# Patient Record
Sex: Female | Born: 1980 | Race: Black or African American | Hispanic: No | Marital: Married
Health system: Midwestern US, Academic
[De-identification: ages and names within clinical notes are randomized; demographics above are authoritative.]

## PROBLEM LIST (undated history)

## (undated) DIAGNOSIS — Z789 Other specified health status: Secondary | ICD-10-CM

## (undated) HISTORY — PX: TOOTH EXTRACTION: SUR596

---

## 2001-11-03 HISTORY — PX: THERAPEUTIC ABORTION: SHX798

## 2004-11-03 HISTORY — PX: OTHER SURGICAL HISTORY: SHX169

## 2004-11-03 HISTORY — PX: DILATION AND CURETTAGE OF UTERUS: SHX78

## 2012-02-04 ENCOUNTER — Encounter (HOSPITAL_COMMUNITY): Payer: Self-pay | Admitting: *Deleted

## 2012-02-04 ENCOUNTER — Inpatient Hospital Stay (HOSPITAL_COMMUNITY)
Admission: AD | Admit: 2012-02-04 | Discharge: 2012-02-04 | Disposition: A | Discharge status: Home / Self Care | Payer: PRIVATE HEALTH INSURANCE | Source: Ambulatory Visit | Attending: Obstetrics & Gynecology | Admitting: Obstetrics & Gynecology

## 2012-02-04 DIAGNOSIS — N949 Unspecified condition associated with female genital organs and menstrual cycle: Secondary | ICD-10-CM | POA: Insufficient documentation

## 2012-02-04 DIAGNOSIS — R102 Pelvic and perineal pain: Secondary | ICD-10-CM

## 2012-02-04 DIAGNOSIS — R109 Unspecified abdominal pain: Secondary | ICD-10-CM | POA: Insufficient documentation

## 2012-02-04 HISTORY — DX: Other specified health status: Z78.9

## 2012-02-04 LAB — DIFFERENTIAL
Basophils Relative: 0 % (ref 0–1)
Eosinophils Absolute: 0.1 10*3/uL (ref 0.0–0.7)
Eosinophils Relative: 1 % (ref 0–5)
Lymphs Abs: 2.9 10*3/uL (ref 0.7–4.0)
Monocytes Absolute: 0.5 10*3/uL (ref 0.1–1.0)
Monocytes Relative: 7 % (ref 3–12)
Neutrophils Relative %: 48 % (ref 43–77)

## 2012-02-04 LAB — URINALYSIS, ROUTINE W REFLEX MICROSCOPIC
Bilirubin Urine: NEGATIVE
Glucose, UA: NEGATIVE mg/dL
Hgb urine dipstick: NEGATIVE
Ketones, ur: NEGATIVE mg/dL
Protein, ur: NEGATIVE mg/dL
Urobilinogen, UA: 0.2 mg/dL (ref 0.0–1.0)

## 2012-02-04 LAB — CBC
Hemoglobin: 13.2 g/dL (ref 12.0–15.0)
MCH: 28.6 pg (ref 26.0–34.0)
MCHC: 33.4 g/dL (ref 30.0–36.0)
MCV: 85.5 fL (ref 78.0–100.0)

## 2012-02-04 MED ORDER — KETOROLAC TROMETHAMINE 60 MG/2ML IM SOLN
60.0000 mg | Freq: Once | INTRAMUSCULAR | Status: AC
  Administered 2012-02-04: 60 mg via INTRAMUSCULAR
  Filled 2012-02-04: qty 2

## 2012-02-04 NOTE — MAU Note (Signed)
Pt had intercourse this AM about 10am, intercourse was vigorous (by husband) and pt experienced pain as severe as her TAB 10 years ago. Pt unable to walk x 1 hour.  Pt went to student health at [employer] and was told to come to MAU for evaluation. LMP March 13 and bleeding as usual with that period.  Hx TAB  10 yrs ago, D&C 2006 (UPT neg) for abnormal bleeding and later in 2006 had SAB.  No birthcontrol used ,  UPT at student health neg and repeated here

## 2012-02-04 NOTE — MAU Provider Note (Signed)
History     CSN: [id number]  Arrival date and time: 02/04/12 1613   First Provider Initiated Contact with Patient 02/04/12 1934      Chief Complaint  Patient presents with  . Abdominal Pain   HPI Cheryl Rollins is 31 y.o. G2P0020 presents after being seen at the clinic at [facility] [employer] for pelvic pain.  They sent her here for evaluation.  LMP 01/14/12.  Not using contraception.  10 years ago she had an abortion and then a miscarriage, D&C in past and is concerned about her fertility, also with irregular cycles.  Intercourse at 10:30 and pain occurred during sex.  "most excruciating pain" after rolling over.  Now the pain is less only when she sitting to standing.  Took tylenol at 11:30.  One partner-husband.    Past Medical History  Diagnosis Date  . No pertinent past medical history     Past Surgical History  Procedure Date  . Therapeutic abortion 2003  . Dilation and curettage of uterus 2006  . Miscarriage 2006  . Tooth extraction     Family History  Problem Relation Age of Onset  . Anesthesia problems Neg Hx     History  Substance Use Topics  . Smoking status: Current Everyday Smoker -- 0.5 packs/day    Types: Cigarettes  . Smokeless tobacco: Never Used  . Alcohol Use: No    Allergies: No Known Allergies  Prescriptions prior to admission  Medication Sig Dispense Refill  . acetaminophen (TYLENOL) 500 MG tablet Take 500 mg by mouth every 6 (six) hours as needed. Stomach pains        Review of Systems  Gastrointestinal: Negative for nausea and vomiting.  Genitourinary:       + pelvic pain after intercourse   Physical Exam   Blood pressure 129/84, pulse 75, temperature 98.3 F (36.8 C), resp. rate 18, height 5\' 9"  (1.753 m), weight 118.842 kg (262 lb), last menstrual period 01/14/2012.  Physical Exam  Constitutional: She appears well-developed and well-nourished.  HENT:  Head: Normocephalic.  Neck: Normal range of motion.  Cardiovascular: Normal rate.     Respiratory: Effort normal.  GI: Soft. There is tenderness (mild lower abdominal pain).  Genitourinary: Uterus is not enlarged and not tender. Cervix exhibits no discharge and no friability. Right adnexum displays no mass, no tenderness and no fullness. Left adnexum displays tenderness (mild). Left adnexum displays no mass and no fullness. Vaginal discharge (normal appearing discharge without odor) found.   Results for orders placed during the hospital encounter of 02/04/12 (from the past 24 hour(s))  URINALYSIS, ROUTINE W REFLEX MICROSCOPIC     Status: Normal   Collection Time   02/04/12  5:00 PM      Component Value Range   Color, Urine YELLOW  YELLOW    APPearance CLEAR  CLEAR    Specific Gravity, Urine 1.025  1.005 - 1.030    pH 6.5  5.0 - 8.0    Glucose, UA NEGATIVE  NEGATIVE (mg/dL)   Hgb urine dipstick NEGATIVE  NEGATIVE    Bilirubin Urine NEGATIVE  NEGATIVE    Ketones, ur NEGATIVE  NEGATIVE (mg/dL)   Protein, ur NEGATIVE  NEGATIVE (mg/dL)   Urobilinogen, UA 0.2  0.0 - 1.0 (mg/dL)   Nitrite NEGATIVE  NEGATIVE    Leukocytes, UA NEGATIVE  NEGATIVE   POCT PREGNANCY, URINE     Status: Normal   Collection Time   02/04/12  5:19 PM      Component Value  Range   Preg Test, Ur NEGATIVE  NEGATIVE   CBC     Status: Normal   Collection Time   02/04/12  5:34 PM      Component Value Range   WBC 6.7  4.0 - 10.5 (K/uL)   RBC 4.62  3.87 - 5.11 (MIL/uL)   Hemoglobin 13.2  12.0 - 15.0 (g/dL)   HCT 16.1  09.6 - 04.5 (%)   MCV 85.5  78.0 - 100.0 (fL)   MCH 28.6  26.0 - 34.0 (pg)   MCHC 33.4  30.0 - 36.0 (g/dL)   RDW 40.9  81.1 - 91.4 (%)   Platelets 205  150 - 400 (K/uL)  DIFFERENTIAL     Status: Normal   Collection Time   02/04/12  5:34 PM      Component Value Range   Neutrophils Relative 48  43 - 77 (%)   Neutro Abs 3.2  1.7 - 7.7 (K/uL)   Lymphocytes Relative 44  12 - 46 (%)   Lymphs Abs 2.9  0.7 - 4.0 (K/uL)   Monocytes Relative 7  3 - 12 (%)   Monocytes Absolute 0.5  0.1 - 1.0 (K/uL)    Eosinophils Relative 1  0 - 5 (%)   Eosinophils Absolute 0.1  0.0 - 0.7 (K/uL)   Basophils Relative 0  0 - 1 (%)   Basophils Absolute 0.0  0.0 - 0.1 (K/uL)   MAU Course  Procedures  MDM   Toradol 60mg  IM ordered.   Assessment and Plan  A:  Pelvic Pain after intercourse      Normal exam findings  P:  May use ibuprofen tomorrow for any remaining discomfort     Return if sxs worsen.    Azile Minardi,EVE M 02/04/2012, 7:35 PM

## 2012-02-04 NOTE — Discharge Instructions (Signed)
Pelvic Pain in Women, Generic  Pelvic pain may be constant or come and go. It may be mild or severe. It is important to tell your caregiver exactly where the pain is located, when and how it occurs, and if it is related to your menstrual periods or stress. We have not found a definite cause for your pelvic pain today and you may need follow-up testing and examination.  CAUSES    Sexually transmitted diseases (STDS) cause pelvic inflammatory disease (PID). This is one of the most common causes of pelvic pain. It is an infection of the female sexual organs.   Endometriosis - This is a condition where some of the inside lining of the uterus is growing in the pelvis and abdomen outside the uterus. Along with (chronic) pain, this can cause infertility.   Tubal pregnancy - This is a serious condition where the pregnancy has occurred in a fallopian tube. Rupture of the tube can bleed heavily and cause death if it is not found in time.   Interstitial cystitis is an inflammation of the bladder that causes pelvic pain. People with severe cases of IC may urinate as many as 60 times a day.   Fibroids: A small percentage of women have uterine fibroids (non-cancerous smooth muscle growths in the uterus). Fibroids do not always cause pain.   Fibromyalgia is a disorder with symptoms of widespread muscle pain, fatigue and multiple tender points on the body.   Dysmenorrhea is painful menstrual periods.   Mittlesmertz is pain with ovulation.   Pelvic congestive syndrome, is engorgement of the pelvic veins just before and during a menstrual period.   Cervical stenosis is when the opening of the cervix is too small and causes pain during menstruation.   Adenomyosis (a type of endometriosis) glands that line the inside of the uterus lying in the muscle layer of the uterus.   Intestinal problems such as irritable bowel syndrome colitis or ileitis.   Appendicitis.   Pelvic cancer. Usually the cancer has been there for awhile  before causing pain.   Bladder infection.   Cysts or ovarian tumors.   Kidney stone.   Psychological factors (stress, sexual abuse or depression).   IUD (intrauterine device).   Prolapse (falling down of the uterus).   Retroflexed uterus - the uterus is tipped too far backwards.   Muscle spasms of the pelvic muscles.   Muscular-skeletal problems of the back (herniated disc).  DIAGNOSIS    Your caregiver may order testing, such as:   Blood tests.   Cultures to test for infection.   Ultrasound.   Looking into the bladder with a metal tube with a light (cystoscopy).   Looking into the pelvis and abdomen with very small incisions through a metal tube with a light (laparoscopy).   Looking into the large intestine with a fibro-optic tube with a light (colonoscopy).   CT scan - a type of X-ray to view the internal organs of the pelvis and abdomen.   MRI - views the pelvic and abdominal organs with a magnetic machine.   Intravenous pyelogram - views the kidneys, ureter and bladder after injecting dye through the vein by X-rays.   Injecting barium into the large intestine to view the intestine with X-rays (barium enema).   Not all test results are available during your visit. If your test results are not back during the visit, make an appointment with your caregiver or the medical facility. It is important for you to follow up   on all of your test results.  TREATMENT   Treatment will depend on the cause of the pain, such as:   Medication, antibiotics, pain medication, muscle relaxants, anti-depression drugs, hormones or birth control pills.   Physical therapy.   Acupuncture.   Psychiatric counseling.   Nerve blocks.   Surgery.  HOME CARE INSTRUCTIONS    Finish all medication as prescribed. Incomplete treatment will put you at risk for sterility and tubal pregnancy if your caregiver feels your pain is caused by an infection.   Rest and eat a balanced diet with plenty of fluids.   If you do have an  infection, your recent sexual partners may need treatment even if they are symptom-free or have a negative culture or evaluation. You also need follow-up to make sure you are no longer infected.   Only take over-the-counter or prescription medicines for pain, discomfort or fever as directed by your caregiver.   Apply warm or cold compresses to the lower abdomen depending on which one helps the pain.   Avoid stressful situations that may cause the pain.   Group therapy is sometimes helpful.   Make sure to follow all instructions. Some of the conditions listed above can have very serious outcomes if you do not take the time to follow-up with your caregiver.  SEEK IMMEDIATE MEDICAL CARE IF:    There is heavier bleeding from the birth canal (vagina).   You develop increasing abdominal pain.   You feel lightheaded or pass out.   An unexplained oral temperature above 102 F (38.9 C) develops.   Any of the problems which brought you to us are getting worse.   You are being physically or sexually abused.   You have painful urination.   You are still having pain four hours after taking prescription pain medication.   You have uncontrolled diarrhea.   You have abnormal vaginal discharge.  Document Released: 09/16/2004 Document Revised: 10/09/2011 Document Reviewed: 10/17/2008  ExitCare Patient Information 2012 [employer].

## 2012-02-04 NOTE — MAU Note (Signed)
Pt states no bleeding, still painful, rates pain 4 with movement.

## 2013-11-19 ENCOUNTER — Emergency Department (HOSPITAL_COMMUNITY): Payer: BC Managed Care – PPO

## 2013-11-19 ENCOUNTER — Encounter (HOSPITAL_COMMUNITY): Payer: Self-pay | Admitting: Emergency Medicine

## 2013-11-19 ENCOUNTER — Emergency Department (HOSPITAL_COMMUNITY)
Admission: EM | Admit: 2013-11-19 | Discharge: 2013-11-19 | Disposition: A | Discharge status: Home / Self Care | Payer: BC Managed Care – PPO | Attending: Emergency Medicine | Admitting: Emergency Medicine

## 2013-11-19 DIAGNOSIS — I493 Ventricular premature depolarization: Secondary | ICD-10-CM

## 2013-11-19 DIAGNOSIS — R42 Dizziness and giddiness: Secondary | ICD-10-CM | POA: Insufficient documentation

## 2013-11-19 DIAGNOSIS — I4949 Other premature depolarization: Secondary | ICD-10-CM | POA: Insufficient documentation

## 2013-11-19 DIAGNOSIS — F17200 Nicotine dependence, unspecified, uncomplicated: Secondary | ICD-10-CM | POA: Insufficient documentation

## 2013-11-19 LAB — CBC
HCT: 36.6 % (ref 36.0–46.0)
Hemoglobin: 12.2 g/dL (ref 12.0–15.0)
MCH: 28.4 pg (ref 26.0–34.0)
MCHC: 33.3 g/dL (ref 30.0–36.0)
MCV: 85.3 fL (ref 78.0–100.0)
PLATELETS: 206 10*3/uL (ref 150–400)
RBC: 4.29 MIL/uL (ref 3.87–5.11)
RDW: 13.2 % (ref 11.5–15.5)
WBC: 6.1 10*3/uL (ref 4.0–10.5)

## 2013-11-19 LAB — BASIC METABOLIC PANEL
BUN: 14 mg/dL (ref 6–23)
CALCIUM: 9.2 mg/dL (ref 8.4–10.5)
CHLORIDE: 103 meq/L (ref 96–112)
CO2: 24 meq/L (ref 19–32)
Creatinine, Ser: 0.69 mg/dL (ref 0.50–1.10)
GFR calc Af Amer: >90 mL/min (ref >90)
GFR calc non Af Amer: >90 mL/min (ref >90)
Glucose, Bld: 90 mg/dL (ref 70–99)
Potassium: 3.8 mEq/L (ref 3.7–5.3)
SODIUM: 139 meq/L (ref 137–147)

## 2013-11-19 LAB — TROPONIN I

## 2013-11-19 LAB — D-DIMER, QUANTITATIVE (NOT AT ARMC): D DIMER QUANT: 0.65 ug{FEU}/mL — AB (ref 0.00–0.48)

## 2013-11-19 MED ORDER — SODIUM CHLORIDE 0.9 % IV BOLUS (SEPSIS)
1000.0000 mL | Freq: Once | INTRAVENOUS | Status: AC
  Administered 2013-11-19: 1000 mL via INTRAVENOUS

## 2013-11-19 MED ORDER — IOHEXOL 350 MG/ML SOLN
100.0000 mL | Freq: Once | INTRAVENOUS | Status: AC | PRN
  Administered 2013-11-19: 100 mL via INTRAVENOUS

## 2013-11-19 NOTE — Discharge Instructions (Signed)
1. Medications: usual home medications 2. Treatment: rest, drink plenty of fluids,  3. Follow Up: Please followup with your primary doctor for discussion of your diagnoses and further evaluation after today's visit; if you do not have a primary care doctor use the resource guide provided to find one; Please also follow-up with [facility] for further evaluation of your symptoms   Premature Ventricular Contraction Premature ventricular contraction (PVC) is an irregularity of the heart rhythm involving extra or skipped heartbeats. In some cases, they may occur without obvious cause or heart disease. Other times, they can be caused by an electrolyte change in the blood. These need to be corrected. They can also be seen when there is not enough oxygen going to the heart. A common cause of this is plaque or cholesterol buildup. This buildup decreases the blood supply to the heart. In addition, extra beats may be caused or aggravated by:  Excessive smoking.  Alcohol consumption.  Caffeine.  Certain medications  Some street drugs. SYMPTOMS   The sensation of feeling your heart skipping a beat (palpitations).  In many cases, the person may have no symptoms. SIGNS AND TESTS   A physical examination may show an occasional irregularity, but if the PVC beats do not happen often, they may not be found on physical exam.  Blood pressure is usually normal.  Other tests that may find extra beats of the heart are:  An EKG (electrocardiogram)  A Holter monitor which can monitor your heart over longer periods of time  An Angiogram (study of the heart arteries). TREATMENT  Usually extra heartbeats do not need treatment. The condition is treated only if symptoms are severe or if extra beats are very frequent or are causing problems. An underlying cause, if discovered, may also require treatment.  Treatment may also be needed if there may be a risk for other more serious cardiac arrhythmias.    PREVENTION   Moderation in caffeine, alcohol, and tobacco use may reduce the risk of ectopic heartbeats in some people.  Exercise often helps people who lead a sedentary (inactive) lifestyle. PROGNOSIS  PVC heartbeats are generally harmless and do not need treatment.  RISKS AND COMPLICATIONS   Ventricular tachycardia (occasionally).  There usually are no complications.  Other arrhythmias (occasionally). SEEK IMMEDIATE MEDICAL CARE IF:   You feel palpitations that are frequent or continual.  You develop chest pain or other problems such as shortness of breath, sweating, or nausea and vomiting.  You become light-headed or faint (pass out).  You get worse or do not improve with treatment. Document Released: 06/06/2004 Document Revised: 01/12/2012 Document Reviewed: 12/17/2007 [employer] Patient Information 2014 [employer], [place].

## 2013-11-19 NOTE — ED Notes (Addendum)
She c/o recent palpitations (since this Monday).  She states they are brief and self-limiting.  She states that she became concerned this Tues. When she stood up and "got real dizzy and weak". At that time she went to her college's infirmary, where they applied cardiac monitor briefly and told her she has some "premature beats, but everything looks ok".  Her skin is normal, warm and dry and she is in no distress.  She also tells me that she recalls having worn a holter monitor "for two days one time when I was young--but I can't remember what for".  I spoke with her about 5 full minutes, and as I watched her monitor she was in nsr without ectopy. She also states that when she is having the palpitations she has some upper central chest discomfort.  She further states that she has been short of breath today, about which she also is concerned.

## 2013-11-19 NOTE — ED Provider Notes (Signed)
CSN: [id number]     Arrival date & time 11/19/13  1625 History   First MD Initiated Contact with Patient 11/19/13 1708     Chief Complaint  Patient presents with  . Palpitations   (Consider location/radiation/quality/duration/timing/severity/associated sxs/prior Treatment) Patient is a 33 y.o. female presenting with palpitations. The history is provided by the patient and medical records. No language interpreter was used.  Palpitations Associated symptoms: chest pain and shortness of breath   Associated symptoms: no back pain, no cough, no diaphoresis, no nausea and no vomiting     Cheryl Rollins is a 33 y.o. female  with no known medical history presents to the Emergency Department complaining of intermittent, increasing in frequency episodes of palpitations beginning one week ago.  Patient reports the same day that she had her initial episode she had an episode of "dizziness" when she stood up from her seat but did not have any syncopal episode.  Patient reports episodes of palpitations occur 4-5 times per day and they are associated with chest pain and no syncope.  Chest pain resolves completely when the episode resolves. Patient reports minimal water intake and some caffeine intake.  Last menstrual period: 4 weeks ago.  Patient reports she has no cardiac history but wore a Holter monitor when she was young. She cannot remember any further information.  Patient reports that she began her last semester of school on Monday, the same day that symptoms began.  She denies increased stress. She was evaluated at the college infirm[facility] where she was told that she was having premature beats, but all her blood work was normal. She reports she developed shortness of breath today and came to be evaluated.     Past Medical History  Diagnosis Date  . No pertinent past medical history    Past Surgical History  Procedure Laterality Date  . Therapeutic abortion  2003  . Dilation and curettage of uterus  2006   . Miscarriage  2006  . Tooth extraction     Family History  Problem Relation Age of Onset  . Anesthesia problems Neg Hx    History  Substance Use Topics  . Smoking status: Current Every Day Smoker -- 0.50 packs/day    Types: Cigarettes  . Smokeless tobacco: Never Used  . Alcohol Use: No   OB History   Grav Para Term Preterm Abortions TAB SAB Ect Mult Living   2    2 1 1    0     Review of Systems  Constitutional: Negative for fever, diaphoresis, appetite change, fatigue and unexpected weight change.  HENT: Negative for mouth sores.   Eyes: Negative for visual disturbance.  Respiratory: Positive for shortness of breath. Negative for cough, chest tightness and wheezing.   Cardiovascular: Positive for chest pain and palpitations.  Gastrointestinal: Negative for nausea, vomiting, abdominal pain, diarrhea and constipation.  Endocrine: Negative for polydipsia, polyphagia and polyuria.  Genitourinary: Negative for dysuria, urgency, frequency and hematuria.  Musculoskeletal: Negative for back pain and neck stiffness.  Skin: Negative for rash.  Allergic/Immunologic: Negative for immunocompromised state.  Neurological: Positive for light-headedness. Negative for syncope and headaches.  Hematological: Does not bruise/bleed easily.  Psychiatric/Behavioral: Negative for sleep disturbance. The patient is not nervous/anxious.     Allergies  Review of patient's allergies indicates no known allergies.  Home Medications   Current Outpatient Rx  Name  Route  Sig  Dispense  Refill  . acetaminophen (TYLENOL) 500 MG tablet   Oral   Take 500  mg by mouth every 6 (six) hours as needed. Stomach pains          BP 136/86  Pulse 67  Temp(Src) 98.3 F (36.8 C) (Oral)  Resp 11  SpO2 100%  LMP 10/22/2013 Physical Exam  Nursing note and vitals reviewed. Constitutional: She is oriented to person, place, and time. She appears well-developed and well-nourished. No distress.  Awake, alert,  nontoxic appearance  HENT:  Head: Normocephalic and atraumatic.  Mouth/Throat: Oropharynx is clear and moist. No oropharyngeal exudate.  Eyes: Conjunctivae are normal. Pupils are equal, round, and reactive to light. No scleral icterus.  Neck: Normal range of motion. Neck supple.  Cardiovascular: Normal rate, regular rhythm, normal heart sounds and intact distal pulses.   No murmur heard. Regular rate and rhythm - no palpable or audible PVCs No murmur  Pulmonary/Chest: Effort normal and breath sounds normal. No respiratory distress. She has no wheezes.  Clear and equal breath sounds  Abdominal: Soft. Bowel sounds are normal. She exhibits no distension and no mass. There is no tenderness. There is no rebound and no guarding.  Abdomen soft and nontender  Musculoskeletal: Normal range of motion. She exhibits no edema.  No peripheral edema No calf tenderness  No palpable cord Negative Homans sign  Lymphadenopathy:    She has no cervical adenopathy.  Neurological: She is alert and oriented to person, place, and time. She exhibits normal muscle tone. Coordination normal.  Speech is clear and goal oriented Moves extremities without ataxia  Skin: Skin is warm and dry. She is not diaphoretic. No erythema.  Psychiatric: She has a normal mood and affect. Her behavior is normal.    ED Course  Procedures (including critical care time) Labs Review Labs Reviewed  CBC  BASIC METABOLIC PANEL  D-DIMER, QUANTITATIVE  TROPONIN I   Imaging Review No results found.  EKG Interpretation    Date/Time:  Saturday November 19 2013 16:50:07 EST Ventricular Rate:  70 PR Interval:  189 QRS Duration: 77 QT Interval:  367 QTC Calculation: 396 R Axis:   62 Text Interpretation:  Sinus rhythm Baseline wander in lead(s) V5 Otherwise normal ECG No old tracing to compare Confirmed by GOLDSTON  MD, SCOTT ([id number]) on 11/19/2013 4:53:50 PM            MDM  No diagnosis found.   Cheryl Rollins presents  with complaints of palpitations and shortness of breath.  On exam the patient appears anxious.  Will give fluids, check lab work and re-evaluate.   Pt ECG nonischemic and without ectopy.  At 18:31 patient complained of palpitations. ECG at that time showed normal sinus rhythm with one PVC.  ECG at that time otherwise nonischemic.  Patient given fluid bolus. We'll reevaluate.  PERC negative.    CBC unremarkable, troponin negative, BMP unremarkable. Patient elevated d-dimer no clinical evidence of DVT and patient low risk.  Will obtain CT angio chest.  CT anterior chest without evidence of PE or other pulmonary abnormality.  Patient reports she is feeling better at this time. Discussed potential causes of PVCs including increased caffeine intake and stress. 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  Patient has been now in the room and reports that she is more stressed than usual.  Patient ambulates without difficulty and no clinical evidence of orthostasis at this time as she is able to change position without precipitation of dizziness,  tachycardia or palpitations.  Patient without tachycardia at rest and with ambulation and afebrile. Discussed adequate fluid intake, decreasing caffeine intake and general well being.  No evidence of ACS at this time.  Also recommended followup with primary care or cardiology for further evaluation.    It has been determined that no acute conditions requiring further emergency intervention are present at this time. The patient/guardian have been advised of the diagnosis and plan. We have discussed signs and symptoms that warrant return to the ED, such as changes or worsening in symptoms.   Vital signs are stable at discharge.   BP 129/84  Pulse 58  Temp(Src) 98.3 F (36.8 C) (Oral)  Resp 18  SpO2 98%  LMP 10/22/2013  Patient/guardian has voiced understanding and  agreed to follow-up with the PCP or specialist.      Dierdre ForthHannah Dietrich Samuelson, PA-C 11/19/13 2147

## 2013-11-20 NOTE — ED Provider Notes (Signed)
Medical screening examination/treatment/procedure(s) were performed by non-physician practitioner and as supervising physician I was immediately available for consultation/collaboration.  EKG Interpretation    Date/Time:  Saturday November 19 2013 16:50:07 EST Ventricular Rate:  70 PR Interval:  189 QRS Duration: 77 QT Interval:  367 QTC Calculation: 396 R Axis:   62 Text Interpretation:  Sinus rhythm Baseline wander in lead(s) V5 Otherwise normal ECG No old tracing to compare Confirmed by Leola Fiore  MD, Takuya Lariccia ([id number]) on 11/19/2013 4:53:50 PM              Audree CamelScott T Khaniya Tenaglia, MD 11/20/13 [phone]

## 2013-12-09 ENCOUNTER — Ambulatory Visit: Payer: PRIVATE HEALTH INSURANCE | Admitting: Cardiology

## 2014-09-04 ENCOUNTER — Encounter (HOSPITAL_COMMUNITY): Payer: Self-pay | Admitting: Emergency Medicine

## 2015-08-03 IMAGING — CR DG CHEST 2V
2 series · 2 of 2 positions shown · non-contrast
Comparison: None.

CLINICAL DATA: Shortness of breath

EXAM:
CHEST  2 VIEW

[w chest pa]
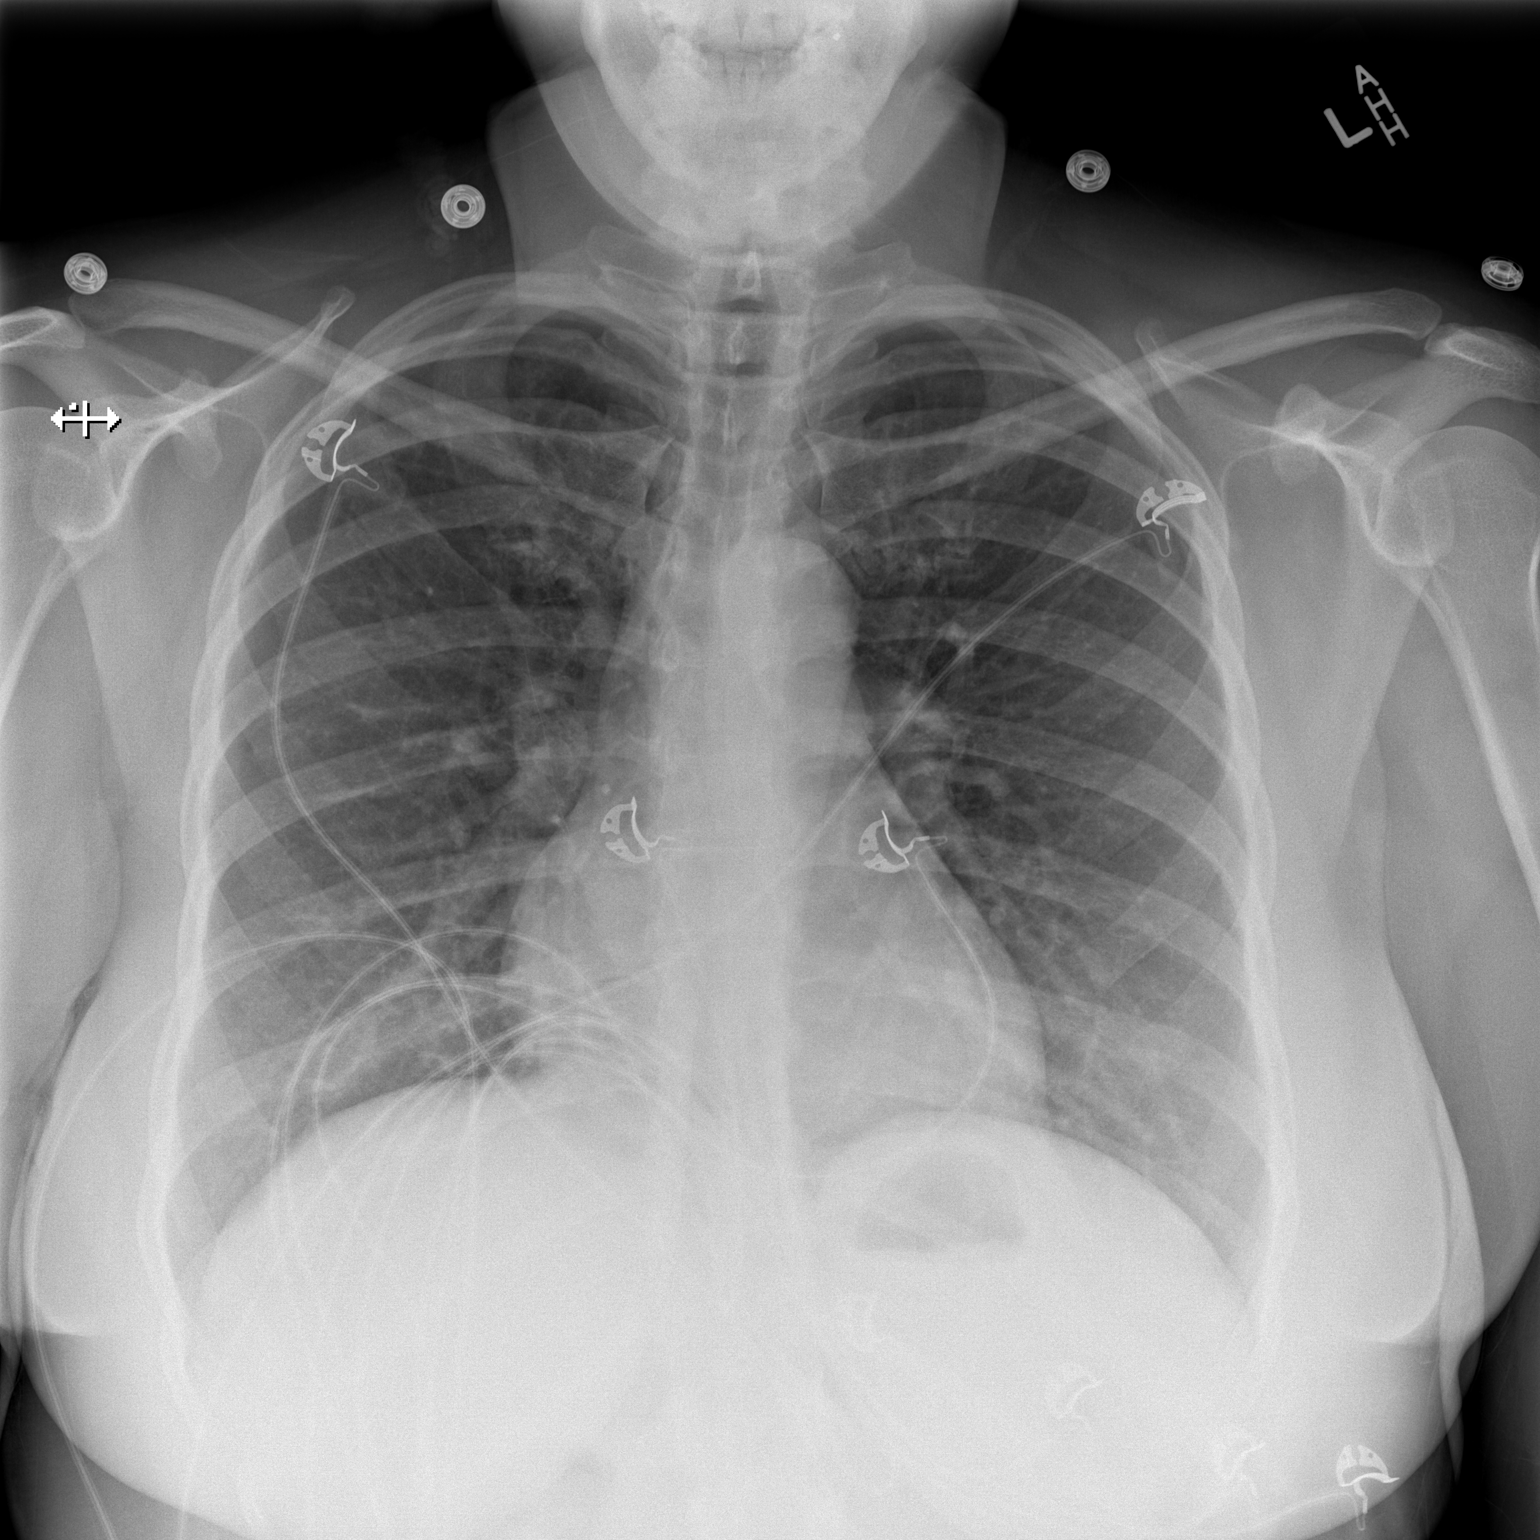

[w chest lat]
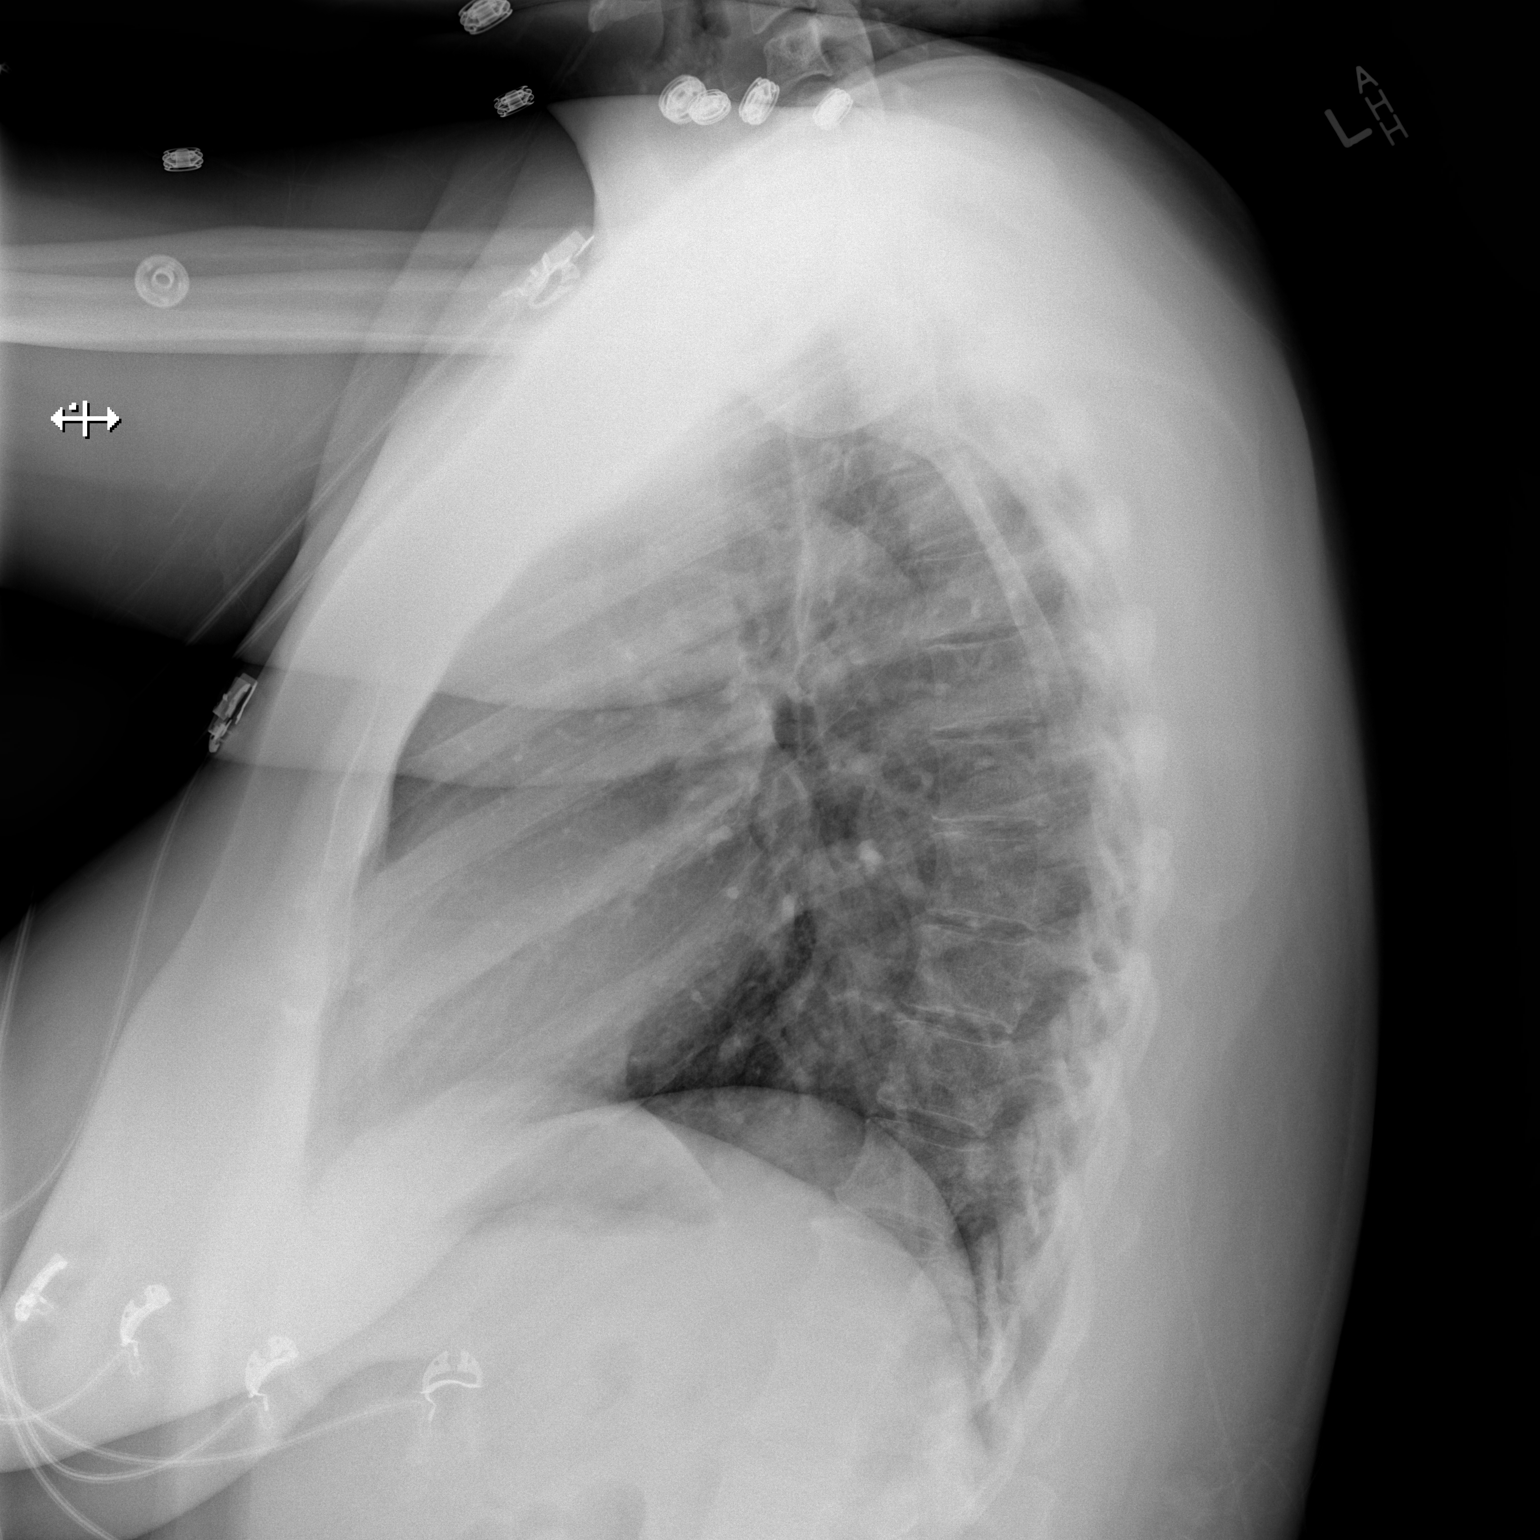

[2 of 2 positions shown; findings below may reference images not displayed]

FINDINGS: Lungs are essentially clear. No focal consolidation. No pleural
effusion or pneumothorax.

The heart is normal in size.

Mild degenerative changes of the visualized thoracolumbar spine.
IMPRESSION: No evidence of acute cardiopulmonary disease.

## 2021-02-01 NOTE — H&P (Signed)
Formatting of this note is different from the original.  GYNECOLOGY PRE-OPERATIVE HISTORY AND PHYSICAL    CC: Leiomyomata    HPI: Patient is a 40 year old year old No obstetric history on file. who presents for her pre-operative history and physical. She is scheduled to undergo Transcervical Ablation of Fibroids for . Previous treatment measures include . ***    ROS:  General: Fever:   HEENT: Sore Throat:   CV: Chest Pain:   Repiratory: Shortness of Breath:   GI: Abdominal pain:   GU: Dysuria:     GYNECOLOGIC HISTORY:  Menstrual History: @LMPFLOW @  She  have bleeding between her periods. She  been hospitalized for vaginal bleeding. She  required transfusions in the past for vaginal bleeding. Previous therapies tried: ***    Menopause: ; ;   Postmenopausal bleeding:   Last Pap Smear: No results found for: PAPSMEAR  Dysplasia: ; Treatments: ***  Sexually Transmitted Infections:     OBSTETRIC HISTORY:  OB History   No obstetric history on file.     MEDICAL HISTORY:  Past Medical History:   Diagnosis Date   ? Miscarriage      MEDICATIONS:  Outpatient Medications as of 02/01/2021   Medication Sig   ? labetalol (NORMODYNE) 200 MG TABS Take 200 mg by mouth 2 (two) times daily. 800MG  TID   ? levothyroxine (SYNTHROID, LEVOTHROID) 75 MCG TABS Take 75 mcg by mouth daily.   ? amLODIPine (NORVASC) 10 MG TABS Take 10 mg by mouth daily.   ? ferrous sulfate 325 (65 FE) MG TABS Take 325 mg by mouth daily with breakfast.   ? Cyanocobalamin (B-12 PO) Take  by mouth daily.   ? diphenhydrAMINE 25 MG TABS Take 150 mg by mouth nightly as needed.   ? Acetaminophen (MIDOL PO) Take  by mouth as needed.     No current facility-administered medications on file as of 02/01/2021.     ALLERGIES / REACTIONS:  Not on File    FAMILY HISTORY:  No family history on file.  Breast Cancer:   Ovarian Cancer:   Endometrial Cancer:   Renal Cancer:   Colon Cancer:   Thrombophilia or Bleeding Disorder:     SURGICAL HISTORY:  Past Surgical History:   Procedure  Laterality Date   ? CERVIX SURGERY      precancerous cells   ? DILATION AND CURETTAGE OF UTERUS  2020   ? HYSTEROSCOPY  2019     SOCIAL HISTORY:   reports that she has been smoking cigarettes. She has been smoking about 0.50 packs per day. She does not have any smokeless tobacco history on file. She reports current alcohol use. She reports current drug use. Drug: Marijuana.  Social History     Socioeconomic History   ? Marital status: Married     Spouse name: Not on file   ? Number of children: Not on file   ? Years of education: Not on file   ? Highest education level: Not on file   Occupational History   ? Not on file   Tobacco Use   ? Smoking status: Current Every Day Smoker     Packs/day: 0.50     Types: Cigarettes   ? Smokeless tobacco: Not on file   Substance and Sexual Activity   ? Alcohol use: Yes     Comment: social   ? Drug use: Yes     Types: Marijuana   ? Sexual activity: Not on file  Other Topics Concern   ? Not on file   Social History Narrative   ? Not on file     Social Determinants of Health     Financial Resource Strain: Not on file   Food Insecurity: Not on file   Transportation Needs: Not on file   Physical Activity: Not on file   Stress: Not on file   Social Connections: Not on file   Intimate Partner Violence: Not on file   Housing Stability: Not on file     THROMBOEMBOLISM RISK ASSESSMENT:  Surgery greater than 30 minutes:   Major surgery:   Age 40-44 years old:   Age over 60 years:   Obesity:   Smoker:   Prior thromboembolism:   Thrombophilia:   Pregnant or Post partum:   Immobility:   Cancer:   OCPs, SERM or Estrogen use:   Acute medical illness:   Inflammatory Bowel Disease:   Myeloproliferative disorder:   Paroxysmal Nocturnal Hemoglobinuria:   Nephrotic syndrome:   Vericose veins:   Central venous catheter:     RESPIRATORY RISK ASSESSMENT:  Patient can tolerate walking on a flat surface, climbing a flight of stairs or light house work all without breathlessness or wheezing:      CARDIOVASCULAR RISK ASSESSMENT:  Age over 45 years (If yes, EKG needed):   Major or inpatient surgery:   Cardiac history, atrial fibrillation, or anti-platelet therapy:   Patient can tolerate walking on a flat surface, climbing a flight of stairs or light house work all without breathlessness or wheezing:     BETA BLOCKER NEED ASSESSMENT:  Already on Beta-blocker:   Ischemic Heart Disease:   Heart Failure:   Cerebrovascular Disease:   Diabetes Mellitus:   Chronic Kidney Disease:     PHYSICAL EXAMINATION:  Vital Signs: There were no vitals filed for this visit.  Appearance/Psychiatric:   Constitutional: The patient  well nourished.  Neck:   Heart:   Lungs:  Breast:  Abd:   Pelvic: The vulva  normal. The urethra  normal. The vagina  normal. The cervix  normal. The uterus  of normal size, shape and contour. The left adnexa  normal. The right adnexa  normal. The perineum  normal.  Pap Smear Performed:   Extremeties:   Skin:     LABS / IMAGING:  {dot Smartphrases: LASTLAB6WEEKS, LASTLAB1DAY, LASTPNL, LASTPE1PANEL, LASTPE2PANEL}    ASSESSMENT AND PLAN:  40 year old No obstetric history on file.   There are no problems to display for this patient.    Surgery indications: ***    Surgeries planned: ***    BV Screen: ***    Antibiotic Prophylaxis: ***    Thrombophilia Prophylaxis: ***    Respiratory workup: ***    Cardiovascular workup: ***    Diabetes Optimization: ***    Tobacco use status: ***    Beta Blocker use: ***    Hospital course discussed: ***    Medications to Continue: ***    Consent signed: *** {Sign Medicaid and Procedure specific forms. Document consent in note using applicable SmartPhrase: CONSENTHYSTERECTOMY, CONSENTSTERILE, CONSENTHYSTEROSCOPY, CONSENTLAPAROSCOPY, CONSENTPROLAPSE, CONSENTGENERIC.}    {Refer patient to financial counselor to ensure payment.}    Floria Raveling, MD      Floria Raveling., MD  02/25/23 2157    Electronically signed by Floria Raveling., MD at 02/25/2023  9:57 PM EDT

## 2021-02-01 NOTE — Unmapped (Signed)
Formatting of this note is different from the original.  GYNECOLOGY PRE-OPERATIVE HISTORY AND PHYSICAL    CC: Leiomyomata    HPI: Patient is a 40 year old year old No obstetric history on file. who presents for her pre-operative history and physical. She is scheduled to undergo Hysteroscopic Myomectomy for Abnormal uterine bleeding. Previous treatment measures include observation.     ROS:  General: Fever: no  HEENT: Sore Throat: no  CV: Chest Pain: no  Repiratory: Shortness of Breath: no  GI: Abdominal pain: no  GU: Dysuria: no    GYNECOLOGIC HISTORY:  Menstrual History: @LMPFLOW @ [facility] -L She does have bleeding between her periods. She has not been hospitalized for vaginal bleeding. She has not required transfusions in the past for vaginal bleeding. Previous therapies tried: observation, D and C, BCP.    Menopause: no; none;   Postmenopausal bleeding: No  Last Pap Smear: No results found for: PAPSMEAR  Dysplasia: no; Treatments: none  Sexually Transmitted Infections: No    OBSTETRIC HISTORY:  OB History   No obstetric history on file.     MEDICAL HISTORY:  Past Medical History:   Diagnosis Date   ? Miscarriage      MEDICATIONS:  Outpatient Medications as of 02/01/2021   Medication Sig   ? labetalol (NORMODYNE) 200 MG TABS Take 200 mg by mouth 2 (two) times daily. 800MG  TID   ? levothyroxine (SYNTHROID, LEVOTHROID) 75 MCG TABS Take 75 mcg by mouth daily.   ? amLODIPine (NORVASC) 10 MG TABS Take 10 mg by mouth daily.   ? ferrous sulfate 325 (65 FE) MG TABS Take 325 mg by mouth daily with breakfast.   ? Cyanocobalamin (B-12 PO) Take  by mouth daily.   ? diphenhydrAMINE 25 MG TABS Take 150 mg by mouth nightly as needed.   ? Acetaminophen (MIDOL PO) Take  by mouth as needed.     No current facility-administered medications on file as of 02/01/2021.     ALLERGIES / REACTIONS:  Not on File    FAMILY HISTORY:  No family history on file.  Breast Cancer: No  Ovarian Cancer: No  Endometrial Cancer: No  Renal Cancer: No  Colon  Cancer: No  Thrombophilia or Bleeding Disorder: No    SURGICAL HISTORY:  Past Surgical History:   Procedure Laterality Date   ? CERVIX SURGERY      precancerous cells   ? DILATION AND CURETTAGE OF UTERUS  2020   ? HYSTEROSCOPY  2019     SOCIAL HISTORY:   reports that she has been smoking cigarettes. She has been smoking about 0.50 packs per day. She does not have any smokeless tobacco history on file. She reports current alcohol use. She reports current drug use. Drug: Marijuana.  Social History     Socioeconomic History   ? Marital status: Married     Spouse name: Not on file   ? Number of children: Not on file   ? Years of education: Not on file   ? Highest education level: Not on file   Occupational History   ? Not on file   Tobacco Use   ? Smoking status: Current Every Day Smoker     Packs/day: 0.50     Types: Cigarettes   ? Smokeless tobacco: Not on file   Substance and Sexual Activity   ? Alcohol use: Yes     Comment: social   ? Drug use: Yes     Types: Marijuana   ?  Sexual activity: Not on file   Other Topics Concern   ? Not on file   Social History Narrative   ? Not on file     Social Determinants of Health     Financial Resource Strain: Not on file   Food Insecurity: Not on file   Transportation Needs: Not on file   Physical Activity: Not on file   Stress: Not on file   Social Connections: Not on file   Intimate Partner Violence: Not on file   Housing Stability: Not on file     THROMBOEMBOLISM RISK ASSESSMENT:  Surgery greater than 30 minutes: no  Major surgery: no  Age 64-70 years old: yes  Age over 60 years: no  Obesity: yes  Smoker: No  Prior thromboembolism: No  Thrombophilia: No  Pregnant or Post partum: no  Immobility: no  Cancer: No  OCPs, SERM or Estrogen use: No  Acute medical illness: No  Inflammatory Bowel Disease: No  Myeloproliferative disorder: no  Paroxysmal Nocturnal Hemoglobinuria: no  Nephrotic syndrome: no  Vericose veins: no  Central venous catheter: no    RESPIRATORY RISK  ASSESSMENT:  Patient can tolerate walking on a flat surface, climbing a flight of stairs or light house work all without breathlessness or wheezing: Yes    CARDIOVASCULAR RISK ASSESSMENT:  Age over 45 years (If yes, EKG needed): no  Major or inpatient surgery: No  Cardiac history, atrial fibrillation, or anti-platelet therapy: No  Patient can tolerate walking on a flat surface, climbing a flight of stairs or light house work all without breathlessness or wheezing: Yes    BETA BLOCKER NEED ASSESSMENT:  Already on Beta-blocker: no  Ischemic Heart Disease: no  Heart Failure: no  Cerebrovascular Disease: no  Diabetes Mellitus: no  Chronic Kidney Disease: no    PHYSICAL EXAMINATION:  Vital Signs:  BP 132/92  P 84  Appearance/Psychiatric: She does not appear anxious.  Constitutional: The patient is well nourished.  Neck: Neck appears symmetric.  Heart: regular rate and rhythm  Lungs:Respirations unlabored, no use of accessory muscles of inspiration noted and clear to auscultation  Breast:symmetric  Abd: Obese Soft, flat and non-tender and No masses or organomegaly  Pelvic: The vulva is normal. The urethra is normal. The vagina is normal. The cervix is normal. The uterus is enlarged ( 10 week size) of normal size, shape and contour. The left adnexa is normal. The right adnexa is normal. The perineum is normal.  Pap Smear Performed: no  Extremeties: Legs are symmetric and without tenderness.  Skin: No rash observed.    LABS / IMAGING:    ASSESSMENT AND PLAN:  40 year old No obstetric history on file.   There are no problems to display for this patient.    Surgery indications: Fibroids    Surgeries planned: [facility] Transcervical Ablatin of Fibroids    BV Screen: neg    Antibiotic Prophylaxis: yes    Thrombophilia Prophylaxis: yes    Respiratory workup: no    Cardiovascular workup: no    Diabetes Optimization: no    Tobacco use status: no    Beta Blocker use: no    Hospital course discussed: yes    Medications to Continue:  Synthroid    Consent signed: Yes     Floria Raveling, MD      Floria Raveling., MD  02/01/21 1141    Electronically signed by Floria Raveling., MD at 02/01/2021 11:41 AM EDT

## 2022-07-25 NOTE — Unmapped (Signed)
lvm requesting return call to r/s 9/29 DT appt  - schedule change

## 2022-07-25 NOTE — Unmapped (Signed)
lvm requesting return call

## 2022-07-25 NOTE — Unmapped (Signed)
PT called back to reschedule appointment. Please call pt back.

## 2022-11-11 ENCOUNTER — Inpatient Hospital Stay: Admit: 2022-11-11 | Payer: PRIVATE HEALTH INSURANCE | Attending: Sports Medicine

## 2022-11-11 ENCOUNTER — Ambulatory Visit: Admit: 2022-11-11 | Discharge: 2022-11-11 | Payer: PRIVATE HEALTH INSURANCE | Attending: Sports Medicine

## 2022-11-11 DIAGNOSIS — M25562 Pain in left knee: Secondary | ICD-10-CM

## 2022-11-11 DIAGNOSIS — S83412A Sprain of medial collateral ligament of left knee, initial encounter: Secondary | ICD-10-CM

## 2022-11-11 NOTE — Unmapped (Signed)
[name]    Carly Bailey  15-Jan-1981    Date of Visit:   11/11/2022    Chief Complaint: Left Knee Pain    History:  Carly Bailey is a 42 y.o. female with left knee pain.  Last Wednesday she was walking her dog outside on the leash when it pulled her forcefully causing her to fall and injure her left knee.  She noticed pain and swelling both on the medial & lateral aspects of the knee.  Since that time the lateral sided pain has diminished, but she continues to have medial sided pain and a feeling of instability.  She did buy an over-the-counter brace with hinges that helps with some stability and helps her get around.  She struggles greatly with mobility and feels that the pain is not getting any better.  The swelling is a little bit better today.    Past Medical History:  History reviewed. No pertinent past medical history.    Past Surgical History:  History reviewed. No pertinent surgical history.    Allergies:  Not on File    Medications:  No current outpatient medications on file.     No current facility-administered medications for this visit.       Social History:  Social History     Socioeconomic History    Marital status: Married     Spouse name: Not on file    Number of children: Not on file    Years of education: Not on file    Highest education level: Not on file   Occupational History    Not on file   Tobacco Use    Smoking status: Not on file    Smokeless tobacco: Not on file   Substance and Sexual Activity    Alcohol use: Not on file    Drug use: Not on file    Sexual activity: Not on file   Other Topics Concern    Not on file   Social History Narrative    Not on file     Social Determinants of Health     Financial Resource Strain: Not on file   Food Insecurity: Not on file   Transportation Needs: Not on file   Physical Activity: Not on file   Stress: Not on file   Social Connections: Not on file   Intimate Partner Violence: Not on  file   Housing Stability: Not on file       Review of systems:  A complete review of systems was performed today per the intake form, which is recorded in EPIC.  This was personally reviewed by me and pertinent positives are noted above in the history.    Physical Exam:  General:  Patient appears her stated age and is in no acute distress.  Neuro/Psych:  Patient is alert and oriented times 3 with an appropriate mood and affect.  Skin:  Examination of the patients skin shows no rashes, ecchymosis, or abrasions.    Left Knee:  Palpation: TTP over medial femoral condyle, mild tenderness over lateral joint line  Range of motion:  0-80 of flexion secondary to pain  Swelling: Mild knee effusion  The knee is stable to varus stress, there is noticeable gapping and elicited pain with valgus stress of the knee  Negative Lachmans test, but there is noticeable guarding  No pain with calf squeeze  Neurovascularly intact to gross inspection distally.  Films:   Radiographs of the left knee independently reviewed demonstrate well-maintained joint space of the tibiofemoral and patellofemoral joints.  There is no evidence of fracture or dislocation.    Assessment:   Carly Bailey is a 42 year old female with left knee pain concerning for a MCL injury.    Plan:   -MRI Left Knee. No PT until after MRI is performed.   -Okay to continue weight bearing as tolerated with hinged knee brace that we will provide in clinic. She is to wear this brace at all times, including sleeping. Will provide script for walker, but encourage she transition to crutches when able.  -Encourage ice, anti-inflammatory medication as needed.  -We discussed that MCL treatment is likely non-operative, but will obtain advanced imaging to ensure no other internal derangement is present.    Mosetta Pigeon, MD  Orthopaedic Surgery Resident, PGY-4    Staff Addendum:  Patient seen, examined, and evaluated with the resident.  Agree with the note above, which I have  edited.    Gayla Medicus, MD

## 2022-11-11 NOTE — Unmapped (Signed)
Completed PA via: [facility]  MRI/CT Type: MRI Left Knee wo  FKCL:[id number]  Valid:11/11/2022-05/10/2023  Approved Location: [facility] for scheduling: Faxed Auth and Order to [facility]) for scheduling at their facility.  Reason for sending Outside:insurance preferred facility   Follow-Up:Will call patient to schedule follow-up once Imaging report is received back from [facility].

## 2022-11-28 ENCOUNTER — Ambulatory Visit: Admit: 2022-11-28 | Discharge: 2022-11-28 | Payer: PRIVATE HEALTH INSURANCE | Attending: Sports Medicine

## 2022-11-28 DIAGNOSIS — S83412A Sprain of medial collateral ligament of left knee, initial encounter: Secondary | ICD-10-CM

## 2022-11-28 DIAGNOSIS — M25562 Pain in left knee: Secondary | ICD-10-CM

## 2022-11-28 NOTE — Unmapped (Signed)
L knee ACL/MCL tear.  Full noet to follow.

## 2022-11-28 NOTE — Unmapped (Signed)
[facility]    PATIENT NAME:      Carly Bailey, Carly Bailey                 MRN:                 [id number]  DATE OF BIRTH:     03/31/1981                    CSN:                 [id number]  PROVIDER:          Quincy Carnes, MD           VISIT DATE:          11/28/2022                                   OFFICE NOTE      Ms. Kolk comes in for her left knee.  Again, she had injury.  We diagnosed with at least an MCL sprain, had her getting an MRI.  She comes in stating she is feeling a little bit better.  She is still using crutches.  She states she feels like she put a   little bit weight on it.  She has been wearing her brace.    PHYSICAL EXAMINATION:  The patient's knee is examined.  Her motion is improved.  She has 0 to about 80 degrees of flexion before she stops me.  Does not really have some instability with valgus stress with gapping.  Tenderness about the medial joint line.  Tenderness about her   tibial plateau.  She does have a positive Lachman's as she can relax enough for me to feel this today.  Seemed to be stable to varus stress.    IMAGING:  The patient had an MRI that is independently reviewed by me.  It shows a complete ACL tear.  She also has high grade MCL rupture of the proximal aspect at its attachment on the epicondyle.  She has impaction fracture at the posterior lateral   tibial plateau as well as a microtrabecular fracture of the posterior medial tibial plateau.  She has a contusion on the lateral femoral condyle consisting with pivot shift injuries.  There is some mild edema about the lateral, collateral as well as the   arcuate and popliteofibular ligament without any full thickness tears.    ASSESSMENT:    1. ACL rupture.  2. MCL rupture.  3. Tibial plateau fracture.  I went over the imaging results with the patient.  She is going to continue in her brace.  I will get her started with physical therapy working on range  of motion as she is still a bit stiff.  I did tell her that once she gets better range of motion, I   would recommend surgical treatment of her ACL as well as possibly her MCL depending upon how well it is healed at the time of surgery.  We briefly went over this process.  She will continue with her brace.  She will continue with crutches.  I told her I   would like for her to be relatively nonweightbearing for the next 2 weeks to allow the tibial plateau fractures to heal.  We will see her back  in 2-3 weeks to assess her motion.  If it is good, then we would move forward with surgical treatment.    Everything was explained in detail and all her questions answered.        Quincy Carnes, MD    CU/AQ  DD: 11/28/2022 12:10:09  DT: 11/29/2022 03:47:00    JOB#:  [id number]

## 2022-12-19 ENCOUNTER — Ambulatory Visit: Payer: PRIVATE HEALTH INSURANCE | Attending: Sports Medicine

## 2022-12-23 ENCOUNTER — Ambulatory Visit: Admit: 2022-12-23 | Discharge: 2022-12-23 | Payer: PRIVATE HEALTH INSURANCE | Attending: Sports Medicine

## 2022-12-23 DIAGNOSIS — S83512A Sprain of anterior cruciate ligament of left knee, initial encounter: Secondary | ICD-10-CM

## 2022-12-23 MED ORDER — lidocaine (PF) 2% (20 mg/mL) Soln 20 mg
20 | Freq: Once | INTRAMUSCULAR | Status: AC | PRN
Start: 2022-12-23 — End: 2022-12-23

## 2022-12-23 NOTE — Telephone Encounter (Signed)
Scheduled with TT 12/26/22.

## 2022-12-23 NOTE — Progress Notes (Signed)
Discussed surgery, full note to follow.

## 2022-12-23 NOTE — Progress Notes (Signed)
[facility]    PATIENT NAME:      Carly Bailey, Carly Bailey                 MRN:                 [id number]  DATE OF BIRTH:     1980/11/27                    CSN:                 [id number]  PROVIDER:          Quincy Carnes, MD           VISIT DATE:          12/23/2022                                   OFFICE NOTE      SUBJECTIVE:  Ms. Buschman comes in for left knee.  Again, she had an injury to this.  We had her get an MRI.  It showed complete ACL tear as well as MCL tear.  She has been in a brace.  She also had some microfractures of the tibial plateau.  She states her   knee is feeling better.  She has been unable to get into formal therapy and she is figuring that out due to her insurance.  She has been working on motion on her own.    PHYSICAL EXAMINATION:  The patient's left knee was examined.  She has a minimal effusion.  Range of motion from 0 to about 110 degree of flexion.  She gets 0 quite easily.  She has some minimal medial joint space opening with valgus stress.  Positive Lachman without good   endpoint.  She is neurovascularly intact distally.    ASSESSMENT:    1. Left knee anterior cruciate ligament tear.  2. Left knee medial collateral ligament tear.  3. Left knee tibial plateau fracture.      PLAN:   The patient's motion is improving.  We went over exercise for her to continue work on this.  At this point, we discussed surgical treatment.  This would consist of ACL reconstruction.  We went over graft options.  She is interested in quadriceps   autograft versus allograft.  At this point, she thinks she would like to go with quadriceps autograft.  I went over the risks, benefits, alternatives of procedure as well as expected recovery course.  She understands how this would differ based upon any   kind of meniscus repair.  We also talked about her MCL and how we would examine that at the time of surgery and if it is loose, we would  also do a repair versus reconstruction with allograft.  She understands how that would also change her postop   recovery.  I went over the risks, benefits, alternatives to all of this as well as expected recovery course and she is interested in surgical treatment and signed the consent.  We will get her set up at a mutually convenient time.  She is going to   continue working on getting everything setup for therapy for postoperative period.        Quincy Carnes, MD      CU/AQ  DD: 12/23/2022 10:49:14  DT: 12/23/2022 11:10:57    JOB#:  [id number]

## 2022-12-23 NOTE — Telephone Encounter (Signed)
Pt will need to have Cardiac clearance. Procedure scheduled 2/26 left knee surgery. Pt was supposed to be seen 08/01/22 but did not reschedule. Pt had other issues at the time. Pt asked to be rescheduled.   Phone [phone]

## 2022-12-24 NOTE — Patient Instructions (Signed)
[facility] for Perioperative Care    We're pleased that you have chosen [facility] for your upcoming procedure.  The staff serving you is professionally trained to provide the highest quality care.  We encourage you to ask questions and to let the staff know your special needs.  We want your visit to be as comfortable as possible. Below you will find instructions for your upcoming procedure. Please read the instructions and follow closely, failure to follow instructions may lead to delay or cancellation of your surgery.     Your procedure is scheduled on 12/29/22.    Please arrive at 6:00 AM.    Location: Come in the main entrance of the hospital and the registration desk will be on your right as soon as you enter the lobby.      [facility]        [place],  [address]    Surgery area phone number:   [phone]       OTHER USEFUL INFORMATION [facility])     If you have any questions about these instructions please call us at:    [facility] for Perioperative Care  Monday - Friday 8:00 am - 4:30 pm  [phone]    For questions regarding surgery, please call your surgeon.     GENERAL INFORMATION BEFORE SURGERY     Contact your surgeon if you develop any of the following before surgery:  A fever or symptoms of an Upper Respiratory Infection.  A skin rash/wound/lesion.  A cold or are sick ANYTIME.    If you use tobacco, please quit as far in advance of surgery as possible.   Patients who quit at least 30 days before surgery may have better outcomes.  NICOTINE PATCHES:  should be removed 24 hours in advance of the surgery.          MEDICATIONS     ? OK TO TAKE     ACETAMINOPHEN (TYLENOL): This is the only over the counter pain medication that is ok to take before surgery.   Vitamins to treat a specific deficiency: ex. Vitamins B, C and D, Calcium, Folate, Iron, Magnesium and Potassium.    ? ONE WEEK PRIOR TO YOUR PROCEDURE STOP/ DO NOT TAKE           (the following are held due to increased risk of bleeding).    MULTIVITAMINS  SUPPLEMENTS/HERBAL SUPPLEMENTS: Glucosamine-Chondroitin, Garlic, Ginseng, Ginko Biloba, Vitamin E, Echinacea, Turmeric, Saw Palmetto, St. John's Wort, Herbal tea  CANNABIS PRODUCTS: ex. CBD oil, Gummies or edibles, THC  ASPIRIN  NSAIDS (non-steroidal anti-inflammatories such as Ibuprofen, Advil, Aleve, Naproxen,                       Excedrin, Meloxicam/Mobic, Celoxib/Celebrex, oral Diclofenac/Voltaren)             CONTINUE ALL OTHER PRESCRIBED HOME MEDICATIONS   UNLESS OTHERWISE INSTRUCTED.     IF your physician has prescribed a special diet or bowel prep prior to your surgery or procedure, please follow the instructions from your physician's office.    MORNING OF THE PROCEDURE  EATING AND DRINKING INSTRUCTIONS       IF your surgery time changes, the times below will need to be adjusted accordingly.     Nothing to eat or drink after midnight except:       MORNING OF SURGERY    DO NOT EAT  OR DRINK ANYTHING (including gum, mints, water, etc) after midnight.    ?TAKE ONLY these medications the morning of surgery with a small sip of water     LABETALOL    BRING YOUR CPAP WITH YOU DAY OF SURGERY.       PLEASE BRUSH YOUR TEETH    SHOWER: BOTH the night before and the morning of surgery with an antibacterial soap, such as Dial.    ?Remove all makeup, dark nail polish, jewelry, wristwatch, body piercings.  ?Do not use powder, lotions, deodorant, and perfume/cologne.   ?Do not shave in the area of the surgery for two (2) days prior to surgery.  If needed, a trained staff member will clip the area immediately before your surgery.    WHAT TO WEAR: Wear clean, loose fitting, and comfortable clothing.    Bring with you, if applicable:  ? List of your medications, dose and how often you take the medication.  ? List of over-the-counter medications and supplements, dose and how often you take the       medication.    ? Photo ID  ? Insurance card      ? Glasses and case  ? Dentures and case   ? Hearing aids and case  ? Crutches, walker, cane (if you have the items and will need them after surgery)      ? Rescue Inhaler (albuterol)  ? CPAP machine and all associated equipment except water  ? Nerve Stimulator/ Remote  ? Other:     ? For OVERNIGHT stay - pack a small bag of items that you may need                                             (ex. change of clothing, toiletries, phone charger)             - Locker space is limited in the surgery area.    Leave at home:    We recommend that you leave valuables (ex. money, jewelry, credit cards) at home or     with your family.   ? Do not bring any medications to the hospital. (Exception: rescue inhaler, transplant medications, Parkinson's, Hepatitis C antivirals or seizure medication.)  ? CONTACT LENSES- leave at home or bring a case for safe keeping.      TRANSPORTATION and VISITORS     VISITING HOURS: Same Day Surgery/Pre-Anesthesia: Open to two visitors per patient. Children under the age of 72 are not permitted in the hospital at this time.    ICU and Stepdown units: Open to two visitors per patient at a time, from 2 p.m. to 7 p.m.    Inpatient units: Open to any number of visitors 24/7, 31 and older.    You will NOT be permitted to drive.  You may take a taxi, uber, etc., only if accompanied by a familiar adult.    If you are going home the same day of surgery please make transportation arrangements and bring ONE responsible adult to accompany you home and remain with you for 24 hours.              WHAT TO EXPECT     Make sure all of your health care givers are checking your ID bracelet and verifying your name and date of birth.  You will actively be involved in verifying the  type of surgery you are having and the correct site.      Your health care givers should be cleaning their hands with soap and water or antibacterial foam before taking care of you and if they do not, it is ok to remind them to do so.   Antibacterial showering and good hand hygiene are essential to prevent surgical site infections and reduce the spread of MRSA.     In an effort to reduce the risks of blood clots after your surgery you may have compression sleeves on your lower legs.  These sleeves help facilitate circulation and decrease the chances of developing any blood clots.     You may be given an incentive spirometer after surgery to use every hour to help prevent pneumonia by having you take deep breaths in and out.  You will be given instructions about proper use after surgery.

## 2022-12-26 NOTE — Progress Notes (Signed)
Requesting surgeon: Dr. Quincy Carnes  Reason for Consult:  Preoperative Evaluation left knee arthroscopy, anterior cruciate ligament reconstruction using quadriceps autograft, possible medial collateral ligament using semitendinosis allograft.   Surgery location:  [place]  Surgery date:12/29/2022    HPI:   42 year old female with history of  left ACL tear.  Presents for pre op evaluation of chronic conditions for  left knee arthroscopy, anterior cruciate ligament reconstruction using quadriceps autograft, possible medial collateral ligament using semitendinosis allograft.       Denies fever, chills, or current illness.  Denies history of anesthesia complications.     Medications:  Current Outpatient Medications   Medication Sig Dispense Refill    amLODIPine (NORVASC) 10 mg tablet Take 1 Tablet (10 mg) by mouth daily. 90 Tablet 3    CYANOCOBALAMIN, VITAMIN B-12, PO Take 1 Tablet by mouth daily.      ferrous sulfate 325 mg (65 mg iron) EC tablet Take 325 mg by mouth daily.      labetaloL (NORMODYNE) 200 mg tablet TAKE 4 TABLETS(800 MG) BY MOUTH EVERY 8 HOURS 360 Tablet 6    levothyroxine (SYNTHROID) 25 mcg tablet Take 1 tablet in the AM on empty stomach.  Nothing to eat or drink for 1 hour after taking medication. 30 Tablet 1     No current facility-administered medications for this visit.       Allergies:  Patient has no known allergies.    History:  Past Medical History:   Diagnosis Date    Anemia     Bronchitis 11/2017    Hypertension     not diagnosed, unable to have dental work d/t high b/p    Motion sickness     Sleep apnea     uses cpap    Thyroid disorder     recent blood work shows low thyroid tp begin medicne       Family:  Family History   Problem Relation Name Age of Onset    Heart Problems Maternal Grandmother      Hypertension Maternal Grandmother      Hypertension Mother      High Cholesterol Mother      Anesthesia Complications Neg Hx         Social history:  Social History      Socioeconomic History    Marital status: Married     Spouse name: Not on file    Number of children: Not on file    Years of education: Not on file    Highest education level: Not on file   Occupational History    Not on file   Tobacco Use    Smoking status: Former     Packs/day: 0.25     Years: 19.00     Additional pack years: 0.00     Total pack years: 4.75     Types: Vape Usage, Cigarettes     Quit date: 02/13/2019     Years since quitting: 3.8     Passive exposure: Past    Smokeless tobacco: Current    Tobacco comments:     vape pen occasional   Substance and Sexual Activity    Alcohol use: Yes     Comment: rare    Drug use: Not Currently     Types: Marijuana    Sexual activity: Yes     Partners: Female   Other Topics Concern    Not on file   Social History Narrative  Not on file     Social Determinants of Health     Financial Resource Strain: Not on file   Food Insecurity: Not on file   Transportation Needs: Not on file   Physical Activity: Not on file   Stress: Not on file   Social Connections: Not on file   Intimate Partner Violence: Not on file   Housing Stability: Not on file       Surgical history:  Past Surgical History:   Procedure Laterality Date    HX DILATION AND CURETTAGE OF UTERUS  2006    evacuation, miscarriage    HX OTHER SURGICAL HISTORY  2006    cervical biopsy     ROS:  Constitutional: Denies unexplained weight loss.    Skin-Denies rashes or unhealing wounds  Neuro- Denies dizziness, headache, or seizures.  HEENT- Denies vision disturbances, tinnitus, vertigo, sinus congestion, or sore throat  Cardiovascular: Denies chest pain, palpitations, dyspnea, or syncope.    Respiratory- Denies SOB, wheezing, hemoptysis, or difficulty breathing.    GU- Denies dysuria or hematuria   GI- Denies abdominal pain, nausea/vomiting, or dysphagia.    Musculoskeletal:  Reports left knee pain  Other- Can climb a flight of stairs or walk up a hill without chest pain or shortness of breath.      Physical Exam:  Vital signs:    Vitals:    12/26/22 1343   BP: (P) 132/84   BP Site: (P) Right arm   BP Position: (P) Sitting   Pulse: (P) 80   Temp: (P) 98.4 F (36.9 C)   SpO2: (P) 96%   Weight: (P) 289 lb (131.1 kg)   Height: (P) 5' 9 (1.753 m)     Constitutional: Alert and oriented x 3, no apparent distress  HEENT: PERRL, EOMI, moist mucus membranes  Neck: Supple.  Resp: CTA bilaterally, no wheezes or rhonchi  Cardio: RRR without MRG.    GI: Soft, nontender, nondistended, BS+  Extremities:  No edema  Neurological: Grossly intact.  Skin: Warm & dry    Assessment:    ICD-10-CM    1. Preop exam for internal medicine  Z01.818       2. Rupture of anterior cruciate ligament of left knee, subsequent encounter  S83.512D       3. Sprain of medial collateral ligament of left knee, subsequent encounter  S83.412D       4. Primary hypertension  I10 CBC WITH DIFFERENTIAL     COMPREHENSIVE METABOLIC PANEL      5. Hypothyroidism, unspecified type  E03.9 TSH 3RD GEN, REFLEX FT4     levothyroxine (SYNTHROID) 25 mcg tablet      6. Elevated cholesterol  E78.00         Functional capacity:  > 4 METs  Inherent cardiac risk of planned procedure: Intermediate (1-5%);   Revised Cardiac Index Score: 0.4%    Revised Cardiac Risk Index (RCRI). 1 point for each of the following: high-risk surgery, CAD, insulin, CKD/Cr>2, history of stroke/TIA, and CHF. Risk for cardiac complications (ischemia, MI, arrhythmia)  0 points=0.4%, 1 point=0.9%, 2 points=4%, 3+ points=9%. High-risk vascular surgery and cardiac history are the most important risk factors.    Risk/benefit analysis favors proceding with the planned operation.    Plan:   1. Preop exam- Left knee arthroscopy, anterior cruciate ligament reconstruction using quadriceps autograft, possible medial collateral ligament using semitendinosis allograft- planned surgery.  2.  Rupture of ACL left knee-See #1  3.  Sprain of medical  collateral ligament-see#1  4.  HTN-stable. Followed by  cardiology.  Cardiology clearance given.  5. Hypothyroidism-pt d/c Synthroid in September 2023.   TSH today.  Restart Synthroid at 25 mcg daily.  Proper medication administration discussed.  Discussed importance of compliance with taking this medication.  She cannot go without it.  Return 6 weeks for TSH recheck. Pt verbalized understanding.    Electronically signed by: Mauri Reading. Foad, NP, 12/26/2022, 2:20 PM

## 2022-12-26 NOTE — Other (Signed)
Called and spoke to patient she is at primary care office to get H&P currently. Notified patient to get a copy and bring with her day of surgery. Gave [facility] fax and phone number.

## 2022-12-26 NOTE — Telephone Encounter (Signed)
Per OV note today Patient is cleared for non-cardiac surgery with no further testing. Fax clearance to [place] at [phone] and to [place] at [phone].    Letter faxed to both numbers.

## 2022-12-29 LAB — POC HCG QUALITATIVE, URINE: hCG Qualitative -Clinitek: NEGATIVE

## 2022-12-29 MED ORDER — lidocaine (PF) 2% (20 mg/mL) 20 mg/mL (2 %) Soln
20 | INTRAMUSCULAR | Status: AC
Start: 2022-12-29 — End: ?

## 2022-12-29 MED ORDER — ondansetron (ZOFRAN) injection
4 | INTRAMUSCULAR | PRN
Start: 2022-12-29 — End: 2022-12-29
  Administered 2022-12-29: 15:00:00 4 via INTRAVENOUS

## 2022-12-29 MED ORDER — ondansetron (ZOFRAN) 4 mg/2 mL injection
4 | INTRAMUSCULAR | Status: AC
Start: 2022-12-29 — End: ?

## 2022-12-29 MED ORDER — senna-docusate (SENNOSIDES-DOCUSATE SODIUM) 8.6-50 mg per tablet
8.6-50 | ORAL_TABLET | Freq: Two times a day (BID) | ORAL | 0 refills | Status: AC | PRN
Start: 2022-12-29 — End: ?

## 2022-12-29 MED ORDER — HYDROmorphone (DILAUDID) 0.5 mg/0.5 mL injection Syrg
0.5 | INTRAMUSCULAR | Status: AC
Start: 2022-12-29 — End: ?

## 2022-12-29 MED ORDER — ceFAZolin 3 g in sodium chloride 0.9% 100 mL ADDaptor IVPB
3 | INTRAVENOUS | Status: AC | PRN
Start: 2022-12-29 — End: 2022-12-29
  Administered 2022-12-29: 13:00:00 3 g via INTRAVENOUS

## 2022-12-29 MED ORDER — propofol 10 mg/ml (DIPRIVAN) 10 mg/mL injection
10 | INTRAVENOUS | Status: AC
Start: 2022-12-29 — End: ?

## 2022-12-29 MED ORDER — dexamethasone (DECADRON) 4 mg/mL injection
4 | INTRAMUSCULAR | Status: AC
Start: 2022-12-29 — End: ?

## 2022-12-29 MED ORDER — ibuprofen (MOTRIN) 600 MG tablet
600 | ORAL_TABLET | Freq: Three times a day (TID) | ORAL | 1 refills | Status: AC
Start: 2022-12-29 — End: ?

## 2022-12-29 MED ORDER — oxyCODONE (ROXICODONE) 5 MG immediate release tablet
5 | ORAL_TABLET | Freq: Four times a day (QID) | ORAL | 0 refills | 6.00000 days | Status: AC | PRN
Start: 2022-12-29 — End: 2023-01-06

## 2022-12-29 MED ORDER — lactated Ringers IV infusion
INTRAVENOUS
Start: 2022-12-29 — End: 2022-12-29
  Administered 2022-12-29: 12:00:00 20 mL/h via INTRAVENOUS
  Administered 2022-12-29: 15:00:00 via INTRAVENOUS

## 2022-12-29 MED ORDER — methocarbamoL (ROBAXIN) 100 mg/mL injection
100 | INTRAMUSCULAR | Status: AC
Start: 2022-12-29 — End: ?

## 2022-12-29 MED ORDER — methocarbamoL (ROBAXIN) injection
100 | INTRAMUSCULAR | PRN
Start: 2022-12-29 — End: 2022-12-29
  Administered 2022-12-29: 15:00:00 1000 via INTRAVENOUS

## 2022-12-29 MED ORDER — ondansetron (ZOFRAN-ODT) 4 MG disintegrating tablet
4 | ORAL_TABLET | Freq: Three times a day (TID) | ORAL | 0 refills | Status: AC | PRN
Start: 2022-12-29 — End: ?

## 2022-12-29 MED ORDER — succinylcholine (QUELICIN) 20 mg/mL injection
20 | INTRAMUSCULAR | Status: AC
Start: 2022-12-29 — End: ?

## 2022-12-29 MED ORDER — aspirin 325 MG tablet
325 | ORAL_TABLET | Freq: Two times a day (BID) | ORAL | 0 refills | 30.00000 days | Status: AC
Start: 2022-12-29 — End: 2023-01-19

## 2022-12-29 MED ORDER — lidocaine (PF) 2% (20 mg/mL) Soln
20 | INTRAMUSCULAR | PRN
Start: 2022-12-29 — End: 2022-12-29
  Administered 2022-12-29: 13:00:00 50 via INTRAVENOUS

## 2022-12-29 MED ORDER — acetaminophen (TYLENOL) tablet 975 mg
325 | ORAL | Status: AC | PRN
Start: 2022-12-29 — End: 2022-12-29
  Administered 2022-12-29: 12:00:00 975 mg via ORAL

## 2022-12-29 MED ORDER — lactated Ringers IV infusion
INTRAVENOUS | Status: AC
Start: 2022-12-29 — End: ?

## 2022-12-29 MED ORDER — celecoxib (CELEBREX) capsule 200 mg
200 | ORAL | Status: AC | PRN
Start: 2022-12-29 — End: 2022-12-29
  Administered 2022-12-29: 12:00:00 200 mg via ORAL

## 2022-12-29 MED ORDER — acetaminophen (TYLENOL) 500 MG tablet
500 | ORAL_TABLET | Freq: Three times a day (TID) | ORAL | 1 refills | 11.00000 days | Status: AC
Start: 2022-12-29 — End: ?

## 2022-12-29 MED ORDER — HYDROmorphone (DILAUDID) injection Syrg
1 | INTRAMUSCULAR | PRN
Start: 2022-12-29 — End: 2022-12-29
  Administered 2022-12-29 (×3): .5 via INTRAVENOUS

## 2022-12-29 MED ORDER — dexamethasone (DECADRON) injection
4 | INTRAMUSCULAR | PRN
Start: 2022-12-29 — End: 2022-12-29
  Administered 2022-12-29: 13:00:00 8 via INTRAVENOUS

## 2022-12-29 MED ORDER — gabapentin (NEURONTIN) capsule 600 mg
300 | ORAL | Status: AC | PRN
Start: 2022-12-29 — End: 2022-12-29
  Administered 2022-12-29: 12:00:00 600 mg via ORAL

## 2022-12-29 MED ORDER — rocuronium (ZEMURON) 10 mg/mL injection
10 | INTRAVENOUS | Status: AC
Start: 2022-12-29 — End: ?

## 2022-12-29 MED ORDER — mineral oil, light sterile (MURI-LUBE) Oil: Start: 2022-12-29 — End: 2022-12-29

## 2022-12-29 MED ORDER — ropivacaine (PF) (NAROPIN) injection solution 0.5%
5 | Freq: Once | INTRAMUSCULAR | Status: AC
Start: 2022-12-29 — End: 2022-12-29
  Administered 2022-12-29: 13:00:00 150 mL via PERINEURAL

## 2022-12-29 MED ORDER — propofol 10 mg/ml (DIPRIVAN) injection
10 | INTRAVENOUS | PRN
Start: 2022-12-29 — End: 2022-12-29
  Administered 2022-12-29: 13:00:00 300 via INTRAVENOUS

## 2022-12-29 MED ORDER — midazolam (PF) (VERSED) injection 2 mg
1 | Freq: Once | INTRAMUSCULAR | Status: AC
Start: 2022-12-29 — End: 2022-12-29
  Administered 2022-12-29: 13:00:00 2 mg via INTRAVENOUS

## 2022-12-29 MED FILL — TYLENOL 325 MG TABLET: 325 325 mg | ORAL | Qty: 3

## 2022-12-29 MED FILL — HYDROMORPHONE 0.5 MG/0.5 ML INJECTION SYRINGE: 0.5 0.5 mg/0.5 mL | INTRAMUSCULAR | Qty: 0.5

## 2022-12-29 MED FILL — DIPRIVAN 10 MG/ML INTRAVENOUS EMULSION: 10 10 mg/mL | INTRAVENOUS | Qty: 20

## 2022-12-29 MED FILL — MIDAZOLAM (PF) 1 MG/ML INJECTION SOLUTION: 1 1 mg/mL | INTRAMUSCULAR | Qty: 2

## 2022-12-29 MED FILL — LIDOCAINE (PF) 20 MG/ML (2 %) INJECTION SOLUTION: 20 20 mg/mL (2 %) | INTRAMUSCULAR | Qty: 5

## 2022-12-29 MED FILL — MURI-LUBE OIL: Qty: 10

## 2022-12-29 MED FILL — ONDANSETRON HCL (PF) 4 MG/2 ML INJECTION SOLUTION: 4 4 mg/2 mL | INTRAMUSCULAR | Qty: 2

## 2022-12-29 MED FILL — DEXAMETHASONE SODIUM PHOSPHATE 4 MG/ML INJECTION SOLUTION: 4 4 mg/mL | INTRAMUSCULAR | Qty: 5

## 2022-12-29 MED FILL — ROBAXIN 100 MG/ML INJECTION SOLUTION: 100 100 mg/mL | INTRAMUSCULAR | Qty: 10

## 2022-12-29 MED FILL — NAROPIN (PF) 5 MG/ML (0.5 %) INJECTION SOLUTION: 5 5 mg/mL (0.5 %) | INTRAMUSCULAR | Qty: 30

## 2022-12-29 MED FILL — LACTATED RINGERS INTRAVENOUS SOLUTION: INTRAVENOUS | Qty: 1000

## 2022-12-29 MED FILL — CELEBREX 200 MG CAPSULE: 200 200 mg | ORAL | Qty: 1

## 2022-12-29 MED FILL — GABAPENTIN 300 MG CAPSULE: 300 300 MG | ORAL | Qty: 2

## 2022-12-29 MED FILL — ROCURONIUM 10 MG/ML INTRAVENOUS SOLUTION: 10 10 mg/mL | INTRAVENOUS | Qty: 5

## 2022-12-29 MED FILL — LACTATED RINGERS INTRAVENOUS SOLUTION: 20.00 20.00 mL/hr | INTRAVENOUS | Qty: 1000

## 2022-12-29 MED FILL — ANECTINE 20 MG/ML INJECTION SOLUTION: 20 20 mg/mL | INTRAMUSCULAR | Qty: 10

## 2022-12-29 MED FILL — CEFAZOLIN 3 GRAM INTRAVENOUS SOLUTION: 3 3 gram | INTRAVENOUS | Qty: 1

## 2022-12-29 NOTE — Discharge Instructions (Addendum)
Anterior Cruciate Ligament (ACL) Repair  Care After  Please read the instructions outlined below and refer to this sheet in the next few weeks. These discharge instructions provide you with general information on caring for yourself after you leave the hospital. While your treatment has been planned according to the most current medical practices available, unavoidable complications occasionally occur. If you have any problems or questions after discharge, please call our office.    HOME CARE INSTRUCTIONS FOLLOWING SURGERY  You may resume a normal diet as tolerated.   Activity:  Minimize activity at home for the first 24-72 hours.  Keep the surgical leg elevated when sitting or lying down.  Use two crutches to bear as much weight as pain allows on the surgical leg.  Wear your brace at all times.  Wound Care:  You may remove the dressing after 5 days and shower.  Pat the wound dry and reapply a dressing and ACE bandage after showering in the first 2 weeks after surgery.  We cleaned your operative extremity with an orange/yellow tinted solution, so the skin above, on, and below the surgical site may look a tinted orange/yellow color.  No baths or whirlpools until 14 days after surgery.  Brace:  The brace should always be worn at all times except for showering, & exercises.  The hinge can be unlocked to allow range of motion in the brace.  Always wear the brace while walking or when out of the house.  Use the brace while sleeping for the first week.  After the first 7 days, you may remove the brace while sitting at home or in a controlled environment.  Home Exercises:  Work on straightening your knee.  Don't put anything under the knee to keep it bent.  Place a pillow under the heel for 10-15 minutes each waking hour.  Physical therapy will show you other exercises you can do at home.  Cold Therapy Unit:   This machine helps reduce pain & swelling.  Use the machine as much as possible for the first week.  Change the  ice every 6-8 hours to keep the temperature cold.  Never place the pad directly on the skin. Place a towel between the pad & skin.   Prescriptions:   You will be given a prescription for pain medication. Take this as prescribed.   If anesthesia provided you with a nerve block before or after surgery, it is not uncommon to feel tingling in the operative extremity as that block wears off.  Almost as important as your surgery is your rehabilitation. A physical therapy prescription will be given.  Please make an appointment to start PT as soon as possible after surgery.  Follow-up: Your postoperative visit 10-14 days following your surgery will have been scheduled prior to your surgery.     SEEK IMMEDIATE MEDICAL CARE IF:  You develop redness, swelling, or increasing pain in the calf.    There is any pus or unusual drainage coming from wound.   You develop an unexplained oral temperature above 102.   You notice a foul (bad) smell coming from the wound or dressing.   The incision breaks open (edges don't stay together) after sutures or staples have been removed.   Shortness of breath or chest pain.       GENERAL DISCHARGE INSTRUCTIONS    * Due to the nature of your surgery, and the anesthesia given, there are some precautions which you should take.  Listed are some guidelines  for you to follow the next 24 hours.      *  Do not drive or work around [place] or use equipment.    *   Do not drink any alcoholic beverages.    *  Do not smoke when alone    *  Avoid making important decisions.    *  Plan to spend a quite, relaxed evening at home.  Resume your normal activities as you begin to feel better.    *  Eat lightly for your first meal, then gradually increase your diet to what is normal for you.  In case of nausea, avoid food and take only clear liquid as tolerated.  Resume food as nausea ceases.    *  If you experience any fever greater than 101F, chills, large amount of bleeding, difficulty breathing, persistent  nausea and vomiting, or any other disturbing problem, notify your surgeon.    *  Cough and deep breathe while awake for the next 24 hours.    *  Call your surgeon's office for a follow-up appointment.               OTHER INSTRUCTIONS

## 2022-12-29 NOTE — Progress Notes (Signed)
Pt. Discharged per wheelchair to exit.    Dressing: C/D/I with brace on    Discharge Pain level- 1.5 Patient states pain is tolerable.    Patient and family verbalized understanding of discharge instructions.      Vitals:    12/29/22 1315   BP: (!) 149/100   Pulse:    Resp:    Temp:    SpO2: (!) 68%             I/O this shift:  In: 1485 [P.O.:135; I.V.:1350]  Out: -   voided    Miguel Dibble

## 2022-12-29 NOTE — Anesthesia Post-Procedure Evaluation (Signed)
Anesthesia Post Note    Patient: Carly Bailey    Procedure(s) Performed: Procedure(s):  LEFT ARTHROSCOPY KNEE, ANTERIOR CRUCIATE LIGAMENT RECONSTRUCTION USING QUADRICEPS AUTOGRAFT    Anesthesia type: general    Patient location: PACU    Airway: Patent    Post pain: Adequate analgesia    Nausea / Vomiting: Absent    Post-operative Hydration Status: Adequate    Post assessment: no apparent anesthetic complications    Last Vitals:   Vitals:    12/29/22 1026 12/29/22 1030 12/29/22 1035 12/29/22 1040   BP: (!) 129/93 (!) 142/97 150/87 (!) 142/96   BP Location: Right upper arm      Patient Position: Lying      BP Cuff Size: Large      Pulse: 83 82 79 77   Resp: 12 11 10 8    Temp: 98.8 F (37.1 C)      TempSrc: Axillary      SpO2: 100% 100% 100%    Weight:       Height:            Last Temperature: 98.8 F (37.1 C) (12/29/2022 10:26 AM)      Post vital signs: stable    Level of consciousness: awake       Complications:  There were no known notable events for this encounter.

## 2022-12-29 NOTE — Op Note (Signed)
Knee Arthroscopy with ACL Reconstruction using Quadriceps Autograft Operative Report    Carly Bailey (14-Dec-1980)  DOS: 12/29/2022     Pre-operative Diagnosis:   1. left knee anterior cruciate ligament tear.  2. left knee MCL tear  3. left knee pain and instability    Post-operative Diagnosis:   1. left knee anterior cruciate ligament tear.    Procedure(s) Performed:  1. left knee arthroscopy with anterior cruciate ligament reconstruction using quadriceps tendon autograft    Surgeon:  Gwenyth Allegra. Bolivar Haw M.D.    Assistant: [name], MD   [place], [place]    Estimated Blood Loss:  Minimal           Drains: None           Specimens: None    Tubes:  None    Tourniquet Time:   Total Tourniquet Time Documented:  Thigh (Left) - 101 minutes  Total: Thigh (Left) - 101 minutes            Complications:  None; patient tolerated the procedure well.           Condition: stable    Indications: Carly Bailey is a 42 y.o. female who presented to clinic with knee pain with clinical and radiographic studies suggesting ACL tear and MCL tear. The risks, benefits, alternatives, complications, treatment options, and expected outcomes of the above procedures were discussed with the patient. She has been in a brace for the MCL and we discussed evaluation in the OR and adding an MCL reconstruction if it remained loose. The risks and potential complications of their problem and purposed treatment include but are not limited to infection, bleeding, pain, stiffness, nerve and vessel injury and complication secondary to the anesthetic. The patient concurred with the proposed plan, giving informed consent.     Findings:  1. Patellofemoral Compartment: stable articular cartilage, tracking midline  2. Medial Compartment:    A) Medial Femoral Condyle stable with evidence of impaction fracture   B) Medial Tibial Plateau stable   C) Meniscus intact  3. Intercondylar notch, ACL was disrupted  4. Lateral Compartment:    A) Lateral Femoral  Condyle stable   B) Lateral Tibial Plateau stable   C) Meniscus intact    Procedure Details:  Site Marking and Surgical Prep  The patient was seen in the pre-operative preparation area, and the site of surgery marked after confirmation by the patient. The patient was properly identified by the Department of Anesthesia and was then transported to the operative theater by hospital gurney.  The patient was placed in a supine position on the OR table with all bony prominences well padded.        After administration of adequate general anesthesia, a well padded tourniquet was then placed on the upper thigh of the operative extremity but not inflated at this time.  The knee was then examined and found to have full passive range of motion.  There was increased anterior translation with Lachman exam and no posterior lateral insufficiency.  The leg was then prepped and draped in the usual sterile fashion.  The patient received pre-operative IV antibiotics.  A time out was performed verifying the correct patient, procedure, and operative extremity.    Exam Under Anesthesia  Positive Lachman without endpoint  Good stability to varus/valgus stress (MCL had healed to equal stability to other side)    Quadriceps Tendon Harvest  Arthroscopic instruments were removed, and the leg was elevated, exsanguinated with an Esmarch and the  tourniquet inflated to 250 mm of mercury.  An incision was made from the superior pole of the patella proximally. The quadriceps tendon was identified and exposed.  The VMO was visualized.  A partial thickness 9 mm in width quadriceps graft was harvested.  It was 65 mm in length.  The graft was taken to the back table and prepared by placing a ultra-button on each end.  The quadriceps tendon harvest site was closed with 0 Vicryl suture.        An incision was made medial to the tibial tubercle.  Using electrocautery, the periosteum was incised and cortical bone exposed medial to the tibial tubercle and  superior to the hamstring tendons.    Diagnostic Arthroscopy  A standard lateral portal was established using a #11 blade.  The trocar and cannula was inserted through this into the joint.  The camera was inserted and inflow were attached. A standard medial portal was made using a spinal needle for localization and a #11 blade. A systematic diagnostic arthroscopy was undertaken with the findings as noted above.      Drilling Tunnels  A combination of a 5.5 shaver and radiofrequency probe were used to debride the residual stump of the ACL.  Similarly soft tissue was removed from the femoral side and intercondylar notch to achieve good visibility of the entire lateral condyle.  A limited notchplasty was performed.  The femoral origin of the ACL was identified and carefully debrided.  The center of the footprint was marked with an arthroscopic awl.  The tibial guide was inserted through the anteromedial portal.  The tip was appropriately positioned and a guide pin drilled into the mid portion of the tibial footprint.  A 9 mm cannulated reamer was used to create the tibial tunnel.  Bone and soft tissue was removed with a shaver and the posterior wall was smoothed with a rasp.  The drill guide for the flexible guide pin was inserted and positioned in the middle of the femoral ACL footprint.  A guide pin was then drilled through the drill guide into the lateral condyle and out the lateral skin.  A 9 mm cannulated flexible reamer was carefully inserted through the inferomedial portal, taking care not to damage the medial femoral condyle.  The reamer was then used to create a 25 mm length tunnel.  The endobutton reamer was then used to perforate the lateral cortex. The anterior wall was smoothed with a rasp.  The tunnel was intact and positioned above the articular surface with 1-2 mm of posterior wall remaining.    Passing/Securing the Graft   Using the femoral guide pin, a #2 fiberwire suture was looped and pulled into  the joint.  The free ends were pulled out the lateral cortex while the loop was retrieved with a grasper through the tibial tunnel. The ultrabutton sutures were threaded and pulled through the tunnels and out the lateral skin.  The button was then pulled through the tunnels, flipped and toggled indicating a good purchase on the femoral cortex.  Distal traction applied to the graft confirmed that it was well fixed.  The button sutures were then toggled pulling the graft into the knee and the femoral tunnel just past the premarked 15 mm line.  The knee was fully extended.  There was no evidence of graft impingement.  The tibial button was then reduced similarly.  With 10-15 lbs of distal traction placed on the sutures and a posterior force applied to the proximal  tibia, the tibial and femoral button were sequentially tightened for graft fixation.  The tibial side was backed up distally with a knotless anchor in the tibia.  Repeat Lachman exam revealed less than 3 mm of anterior translation with a firm endpoint.  Arthroscopic equipment was reinserted into the knee to reveal a well tensioned graft and properly positioned hardware.  The knee was able to come to full extension without graft impingement on the notch.    Closure  The tourniquet was deflated and hemostasis obtained with electrocautery.  The subcutaneous tissue was closed with 2-0 Vicryl suture and the skin closed with 4-0 Monocryl.  Portal sites were closed with 3-0 nylon.  Sterile dressings, a cryotherapy pad, and an elastic bandage were placed.  A hinged knee brace locked in full extension was fitted prior to leaving the operative suite.  The patient was awakened by the anesthesia team, transferred to the gurney, and taken to the postoperative area in stable condition.  The toes were pink and warm.  All sponge and needle counts were correct.

## 2022-12-29 NOTE — TOC Discharge Planning (AHS/AVS) (Signed)
Anesthesia Transfer of Care Note    Patient: Carly Bailey  Procedure(s) Performed: Procedure(s):  LEFT ARTHROSCOPY KNEE, ANTERIOR CRUCIATE LIGAMENT RECONSTRUCTION USING QUADRICEPS AUTOGRAFT , POSSIBLE MEDIAL COLLATERAL LIGAMENT USING SEMITENDINOSIS ALLOGRAFT    Patient location: PACU    Anesthesia type: general    Airway Device on Arrival to PACU/ICU: Nasal Cannula    IV Access: Peripheral    Monitors Recommended to be Used During PACU/ICU: Standard Monitors    Outstanding Issues to Address: None    Level of Consciousness: awake    Post vital signs:    Vitals:    12/29/22 1031   BP: 129/93   Pulse: 81   Resp: 17   Temp:    SpO2: 007%       Complications:  There were no known notable events for this encounter.    Date 12/28/22 0700 - 12/29/22 0659(Not Admitted) 12/29/22 0700 - 12/30/22 0659   Shift 0700-1459 1500-2259 2300-0659 24 Hour Total 0700-1459 1500-2259 2300-0659 24 Hour Total   INTAKE   I.V.     1000(7.4)   1000(7.4)     Volume (mL) (lactated Ringers IV infusion)     1000   1000   Shift Total(mL/kg)     1000(7.4)   1000(7.4)   OUTPUT   Shift Total(mL/kg)           Weight (kg)   134.3 134.3 134.3 134.3 134.3 134.3

## 2022-12-29 NOTE — Anesthesia Pre-Procedure Evaluation (Addendum)
[facility]  PRE-PROCEDURAL EVALUATION    Carly Bailey is a 42 y.o. year old female presenting for:    Procedure(s):  LEFT ARTHROSCOPY KNEE, ANTERIOR CRUCIATE LIGAMENT RECONSTRUCTION USING QUADRICEPS AUTOGRAFT , POSSIBLE MEDIAL COLLATERAL LIGAMENT USING SEMITENDINOSIS ALLOGRAFT    Surgeon:   Quincy Carnes, MD    Chief Complaint     Rupture of anterior cruciate ligament of left knee, initial encounter [S83*    Review of Systems     Anesthesia Evaluation    Patient summary reviewed.       No history of anesthetic complications   I have reviewed the History and Physical Exam, any relevant changes are noted in the anesthesia pre-operative evaluation.      Cardiovascular:        Hypertension is.      (-) past MI.    Neuro/Muscoloskeletal/Psych:          (-) seizures.     Pulmonary:              (-) COPD, asthma, recent URI, sleep apnea.       GI/Hepatic/Renal:            (-) GERD, liver disease, renal disease.    Endo/Other:          (-) diabetes mellitus, hypothyroidism.      Past Medical History     Past Medical History:   Diagnosis Date    Fibroid     Hypertension     Miscarriage        Past Surgical History     Past Surgical History:   Procedure Laterality Date    DILATION AND CURETTAGE OF UTERUS      HYSTERECTOMY      UTERINE FIBROID SURGERY         Family History     History reviewed. No pertinent family history.    Social History     Social History     Socioeconomic History    Marital status: Married     Spouse name: Not on file    Number of children: Not on file    Years of education: Not on file    Highest education level: Not on file   Occupational History    Not on file   Tobacco Use    Smoking status: Every Day     Types: Cigarettes, E-cigs/Vape    Smokeless tobacco: Current   Substance and Sexual Activity    Alcohol use: Yes     Comment: SOCIALLY    Drug use: Never    Sexual activity: Not on file   Other Topics Concern    Not on file   Social History Narrative     Not on file     Social Determinants of Health     Financial Resource Strain: Not on file   Food Insecurity: No Food Insecurity (11/11/2022)    Yearly Questionnaire     Do you need any assistance with obtaining housing, meals, medication, transportation or medical equipment?: No     Assistance needed for:: Not on file   Transportation Needs: No Transportation Needs (11/11/2022)    Yearly Questionnaire     Do you need any assistance with obtaining housing, meals, medication, transportation or medical equipment?: No     Assistance needed for:: Not on file   Physical Activity: Not on file   Stress: Not on file   Social Connections: Not on file   Intimate Partner Violence:  Not At Risk (12/29/2022)    Humiliation, Afraid, Rape, and Kick questionnaire     Fear of Current or Ex-Partner: No     Emotionally Abused: No     Physically Abused: No     Sexually Abused: No   Housing Stability: Low Risk  (11/11/2022)    Yearly Questionnaire     Do you need any assistance with obtaining housing, meals, medication, transportation or medical equipment?: No     Assistance needed for:: Not on file       Medications     Allergies:  No Known Allergies    Home Meds:  Prior to Admission medications as of 12/24/22 1434   Medication Sig Taking?   amLODIPine (NORVASC) 10 MG tablet Take 1 tablet (10 mg total) by mouth daily. Yes   cyanocobalamin 2000 MCG tablet Take 1 tablet (2,000 mcg total) by mouth daily. Yes   ferrous sulfate 325 (65 FE) MG EC tablet Take 1 tablet (325 mg total) by mouth daily with breakfast. Yes   labetaloL (NORMODYNE) 200 MG tablet Take 1 tablet (200 mg total) by mouth 3 times a day. Yes       Inpatient Meds:  Scheduled:   Continuous:    lactated Ringers         PRN: acetaminophen, cefazolin, celecoxib, gabapentin    Vital Signs     Wt Readings from Last 3 Encounters:   12/23/22 (!) 280 lb (127 kg)   11/28/22 (!) 280 lb (127 kg)   11/11/22 (!) 280 lb (127 kg)     Ht Readings from Last 3 Encounters:   12/23/22 5' 9  (1.753 m)   11/28/22 5' 9 (1.753 m)   11/11/22 5' 9 (1.753 m)     Temp Readings from Last 3 Encounters:   No data found for Temp     BP Readings from Last 3 Encounters:   No data found for BP     Pulse Readings from Last 3 Encounters:   No data found for Pulse     @LASTSAO2 (3)@    Physical Exam     Airway:     Mallampati: I  Mouth Opening: >2 FB  Neck ROM: full    Dental:            Pulmonary:      Breathing: unlabored         Cardiovascular:     Rhythm: regular  Rate: normal    Neuro/Musculoskeletal/Psych:     Mental status: alert and oriented to person, place and time.          Abdominal:       Current OB Status:       Other Findings:      Laboratory Data     No results found for: WBC, HGB, HCT, MCV, PLT    No results found for: ABORH    No results found for: GLUCOSE, BUN, CO2, CREATININE, K, NA, CL, CALCIUM, ALBUMIN, PROT, ALKPHOS, ALT, AST, BILITOT    No results found for: PTT, INR    No results found for: PREGTESTUR, PREGSERUM, HCG, HCGQUANT    Anesthesia Plan     ASA 2         Female    Anesthesia Type:  general and Pre-op pain block for post-op pain control.      PONV Risk Factors: female                    Anesthetic plan and  risks discussed with patient.    Plan, alternatives, and risks of anesthesia, including death, have been explained to and discussed with the patient/legal guardian.  By my assessment, the patient/legal guardian understands and agrees.  Scenario presented in detail.  Questions answered.          Plan discussed with fellow and attending.

## 2022-12-29 NOTE — Brief Op Note (Addendum)
LEFT ARTHROSCOPY KNEE, ANTERIOR CRUCIATE LIGAMENT RECONSTRUCTION USING QUADRICEPS AUTOGRAFT , POSSIBLE MEDIAL COLLATERAL LIGAMENT USING SEMITENDINOSIS ALLOGRAFT  Brief Op Note  Carly Bailey  12/29/2022      Pre-op Diagnosis: Rupture of anterior cruciate ligament of left knee, initial encounter [S83.512A]  Sprain of medial collateral ligament of left knee, initial encounter [S83.412A]       Post-op Diagnosis: same    Procedure(s):  LEFT ARTHROSCOPY KNEE, ANTERIOR CRUCIATE LIGAMENT RECONSTRUCTION USING QUADRICEPS AUTOGRAFT ,      Surgeon(s):  Quincy Carnes, MD    Anesthesia: General    Staff:   Circulator: Rodell Perna, RN  Scrub Person: Horris Latino  Assistant: Julio Sicks, MD    Estimated Blood Loss: less than 50 mL                 Specimens:            Drains:               There were no complications unless listed below.         [name], MD    Date: 12/29/2022  Time: 10:06 AM

## 2022-12-29 NOTE — H&P (Signed)
H&P reviewed, patient examined, no changes to H&P.    Varnika Butz, MD

## 2022-12-29 NOTE — Anesthesia Procedure Notes (Signed)
Regional Block       Block Type- Lower Extremity: femoral nerve      Laterality:  left    Medications Administered: Pre Sedation / Block Medications    Medication administered at:  12/29/2022 7:30 AM         Preanesthetic Checklist  Completed:  patient identified, IV checked, risks and benefits discussed, pre-op evaluation, timeout performed, anesthesia consent, hand hygiene performed, patient being monitored, patient agrees, verbalizes understanding, and wants to proceed and questions answered / anesthesia plan accepted      Block Reason: post-op pain management     Diagnosis:  postoperative pain management   Patient Location during procedure:  pre-op holding (SDS)   Block Timing:  preoperatively  Timeout verification:  correct patient, correct procedure, correct site, allergies reviewed and anticoagulant precautions reviewed    Timeout performed at:  07:30 EST  Timeout performed by:  Shantrell Placzek    PROCEDURE:  Block Start Time:  12/29/2022 7:30 AM    Patient Position:  supine  Prep: Chloraprep    Needle: Block needle 21 G 4 in         Injection Technique:  single shot             Continuous ultrasound guidance was utilized for the procedure. Tissue planes, vascular and nerve structures were identified. Relevant anatomy for the intention of the block was noted. Ultrasound image was interpreted, captured, and stored.          Complications:  blood not aspirated, no injection resistance, no paresthesia and no other event       See EMR for periprocedural vitals signs. Stable thoroughout unless otherwise documented      Block End Time:  12/29/2022 7:33 AM    Notes:   30 cc of 0.5% Ropivacaine given perineural. 2 mg of midazolam given IV. Medication documented in [facility]     Staffing:  Anesthesiologist:  Girtha Rm, MD  Resident/Fellow: Holley Dexter, DO      Performed by:  Resident/Fellow

## 2022-12-29 NOTE — Other (Signed)
INTRA-OP POST BRIEFING NOTE: Carly Bailey      Specimens:     Prior to leaving the room: Nurse confirmed name of procedure, completion of instrument, sponge & needle counts, reads specimen labels aloud including patient name and addresses any equipment issues? Nurse confirmed wound class. Nurse to surgeon and anesthesia: What are key concerns for recovery and management of the patient?  Yes      Blood products stored at appropriate temperatures prior to return to blood bank (if applicable)? N/A      Patient identification band secured on patient prior to transfer out of the operating room? Yes    Temporary devices implanted for the duration of the surgery removed and evaluated for intactness and completeness prior to closure? N/A      Other Comments:     Signed: Rodell Perna    Date: 12/29/2022    Time: 10:25 AM

## 2023-01-01 NOTE — Telephone Encounter (Signed)
Patient is asking for home health PT referral because she can't physically get in and out of her car because it sits quite low to  the ground.    Surgery 12/29/2022    LEFT ARTHROSCOPY KNEE, ANTERIOR CRUCIATE LIGAMENT RECONSTRUCTION USING QUADRICEPS AUTOGRAFT

## 2023-01-02 NOTE — Progress Notes (Signed)
[place] order to [facility] and [place] to assist in [facility] scheduling

## 2023-01-12 ENCOUNTER — Ambulatory Visit
Admit: 2023-01-12 | Discharge: 2023-01-12 | Payer: PRIVATE HEALTH INSURANCE | Attending: Rehabilitative and Restorative Service Providers"

## 2023-01-12 ENCOUNTER — Inpatient Hospital Stay
Admit: 2023-01-12 | Discharge: 2023-01-21 | Payer: PRIVATE HEALTH INSURANCE | Attending: Rehabilitative and Restorative Service Providers"

## 2023-01-12 DIAGNOSIS — M25562 Pain in left knee: Secondary | ICD-10-CM

## 2023-01-12 NOTE — Progress Notes (Signed)
[place] AND SP[facility]TS MEDICINE    Carly Bailey  09-Nov-1980     Date of Exam: 01/12/2023    Date of Surgery:  December 29, 2022  Surgical Procedure:  left knee arthroscopy with ACL reconstruction with Quad tendon autograft  Findings:   Findings:  1. Patellofemoral Compartment: stable articular cartilage, tracking midline  2. Medial Compartment:               A) Medial Femoral Condyle stable with evidence of impaction fracture              B) Medial Tibial Plateau stable              C) Meniscus intact  3. Intercondylar notch, ACL was disrupted  4. Lateral Compartment:               A) Lateral Femoral Condyle stable              B) Lateral Tibial Plateau stable              C) Meniscus intact    Carly Bailey returns today for the 2 week post-operative exam. The patient is doing well with minimal complaints of pain.  Was having difficulty starting outpatient PT due to her car being very low and being difficult getting in and out. They did call to request some Home health to start with, unfortunately this has not started yet. No PT to this point. Compliant with brace use. Ambulating with crutches. Denies numbness or tingling distally.    Exam:  The left knee is examined.  The patients incisions are healing well without signs of infection.  There is no erythema or drainage.  There is effusion.  Range of motion is from 5 to 60.  The knee is ligamentously stable to exam today.  Lachman's is negative with firm endpoint. The limb is neurovascularly intact to gross inspection distally.    Films: left knee ACL reconstruction postoperative changes without complication    Assesment: Status post left knee arthroscopy with ACL reconstruction with Quad tendon autograft    Plan:  We went over the surgical pictures with the patient today.  Patient is doing fair. They will begin with physical therapy working on ROM & strength.  She was given new script today. Advised to call today to  schedule outpatient PT. 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 Patient instructed to call office if she doesn't get therapy started soon and we will try and help. They will gradually wean off crutches.  The patient will gradually wean off pain medications. I will see the patient back in 4 weeks to check their progression. Everything was explained in detail to the patient who voiced understanding and agreement with the plan.    Berna Spare, PA

## 2023-01-15 NOTE — Progress Notes (Signed)
Completed and faxed Family leave form for Carly Bailey to [facility] at [phone]. Received confirmation this fax was successfully received.

## 2023-02-16 ENCOUNTER — Ambulatory Visit: Payer: PRIVATE HEALTH INSURANCE | Attending: Rehabilitative and Restorative Service Providers"

## 2023-02-20 ENCOUNTER — Ambulatory Visit: Payer: PRIVATE HEALTH INSURANCE | Attending: Rehabilitative and Restorative Service Providers"

## 2023-02-27 ENCOUNTER — Ambulatory Visit
Admit: 2023-02-27 | Discharge: 2023-02-27 | Payer: PRIVATE HEALTH INSURANCE | Attending: Rehabilitative and Restorative Service Providers"

## 2023-02-27 DIAGNOSIS — Z9889 Other specified postprocedural states: Secondary | ICD-10-CM

## 2023-02-27 NOTE — Progress Notes (Signed)
[facility]  [place] [facility]    ORTHOPEDIC SURGERY AND SPORTS MEDICINE    Carly Bailey  1981-02-27     Date of Exam: 02/27/2023    Date of Surgery:  December 29, 2022  Surgical Procedure:  left knee arthroscopy with ACL reconstruction with Quad tendon autograft  Findings:   Findings:  1. Patellofemoral Compartment: stable articular cartilage, tracking midline  2. Medial Compartment:               A) Medial Femoral Condyle stable with evidence of impaction fracture              B) Medial Tibial Plateau stable              C) Meniscus intact  3. Intercondylar notch, ACL was disrupted  4. Lateral Compartment:               A) Lateral Femoral Condyle stable              B) Lateral Tibial Plateau stable              C) Meniscus intact    [name] returns today for the 6 week post-operative exam. Last visit patient was yet to start therapy and was struggling with post op stiffness. She has since started therapy at [facility] and has noticed good improvement in motion/strength. Out of TROM brace, using hinged brace. Ambulating with cane. Denies numbness or tingling distally.    Exam:  The left knee is examined.  The patients incisions are healing well without signs of infection.  There is no erythema or drainage.  There is effusion.  Range of motion is from 0 to 120.  The knee is ligamentously stable to exam today.  Lachman's is negative with firm endpoint. Good strength with straight leg raise. The limb is neurovascularly intact to gross inspection distally.    Films: left knee ACL reconstruction postoperative changes without complication    Assesment: Status post left knee arthroscopy with ACL reconstruction with Quad tendon autograft    Plan:  Patient doing well today in clinic. Continue with PT for motion/strength. Continue to wean off cane. We discussed that numbness lateral to her incision is normal but should improve some.  I will see the patient back in 6 weeks to check their progression.  Everything was explained in detail to the patient who voiced understanding and agreement with the plan.    Arman Bogus, PA

## 2023-04-17 ENCOUNTER — Ambulatory Visit
Admit: 2023-04-17 | Discharge: 2023-04-17 | Payer: PRIVATE HEALTH INSURANCE | Attending: Rehabilitative and Restorative Service Providers"

## 2023-04-17 DIAGNOSIS — Z9889 Other specified postprocedural states: Secondary | ICD-10-CM

## 2023-04-17 NOTE — Progress Notes (Signed)
[facility]  [place] [facility]    ORTHOPEDIC SURGERY AND SPORTS MEDICINE    Quintessa Quillen  [birth year]     Date of Exam: 04/17/2023    Date of Surgery:  December 29, 2022  Surgical Procedure:  left knee arthroscopy with ACL reconstruction with Quad tendon autograft  Findings:   Findings:  1. Patellofemoral Compartment: stable articular cartilage, tracking midline  2. Medial Compartment:               A) Medial Femoral Condyle stable with evidence of impaction fracture              B) Medial Tibial Plateau stable              C) Meniscus intact  3. Intercondylar notch, ACL was disrupted  4. Lateral Compartment:               A) Lateral Femoral Condyle stable              B) Lateral Tibial Plateau stable              C) Meniscus intact    Kimberle returns today for the 3+ month post-operative exam. Patient stated she is doing very well. Has continued therapy at [facility] and has noticed good improvement in motion/strength. Using hinged brace. No ambulatory aid. Patient states she is still working towards her goal of being able to take her dog (28 year old lab) out for longer walks while maintaining control. Denies numbness or tingling distally.    Exam:  The left knee is examined.  The patients incisions are healing well without signs of infection.  There is no erythema or drainage.  There is no effusion.  Range of motion is from 0 to 130.  The knee is ligamentously stable to exam today.  Lachman's is negative with firm endpoint. Good strength with straight leg raise. The limb is neurovascularly intact to gross inspection distally.    Films: left knee ACL reconstruction postoperative changes without complication    Assesment: Status post left knee arthroscopy with ACL reconstruction with Quad tendon autograft    Plan:  Patient doing well today in clinic. Continue with PT for motion/strength. As strength improves she can wean back on therapy to home exercise program. I will see the patient back in 3  months to check their progression. Everything was explained in detail to the patient who voiced understanding and agreement with the plan.    Arman Bogus, PA

## 2023-07-24 ENCOUNTER — Ambulatory Visit
Admit: 2023-07-24 | Discharge: 2023-07-24 | Payer: PRIVATE HEALTH INSURANCE | Attending: Rehabilitative and Restorative Service Providers"

## 2023-07-24 DIAGNOSIS — Z9889 Other specified postprocedural states: Secondary | ICD-10-CM

## 2023-07-24 MED ORDER — methylPREDNISolone (MEDROL, PAK,) 4 mg tablet
4 | ORAL | 0 refills | Status: AC
Start: 2023-07-24 — End: ?

## 2023-07-24 NOTE — Progress Notes (Signed)
[facility]  [place] [facility]    ORTHOPEDIC SURGERY AND SPORTS MEDICINE    [name]  1980/12/29     Date of Exam: 07/24/2023    Date of Surgery:  December 29, 2022  Surgical Procedure:  left knee arthroscopy with ACL reconstruction with Quad tendon autograft  Findings:   Findings:  1. Patellofemoral Compartment: stable articular cartilage, tracking midline  2. Medial Compartment:               A) Medial Femoral Condyle stable with evidence of impaction fracture              B) Medial Tibial Plateau stable              C) Meniscus intact  3. Intercondylar notch, ACL was disrupted  4. Lateral Compartment:               A) Lateral Femoral Condyle stable              B) Lateral Tibial Plateau stable              C) Meniscus intact    Carly Bailey returns today for the 7+ month post-operative exam. She was doing very well until the last 1-2 weeks. No injury. Had gradual increase in knee pain, worse medially. Noticed some swelling. Pain worse with activity. Had completed therapy at [facility]. No ambulatory aid. Patient states she is still working towards her goal of being able to take her dog (74 year old lab) out for longer walks while maintaining control. Denies numbness or tingling distally.    Exam:  The left knee is examined.  The patients incisions are healing well without signs of infection.  There is no erythema or drainage.  There is mild to moderate effusion.  Range of motion is from 0 to 110.  The knee is ligamentously stable to exam today.  Lachman's is negative with firm endpoint. Good strength with straight leg raise. Tender medial joint line. Mildly positive McMurrays test. Positive bounce home test with significant pain posterior knee. The limb is neurovascularly intact to gross inspection distally.    Films: left knee ACL reconstruction postoperative changes without complication    Assesment: Status post left knee arthroscopy with ACL reconstruction with Quad tendon  autograft    Plan:  Patient having increased pain over previous 1-2 weeks. No injury. ACL feels good today on exam. There is joint effusion and medial joint line pain. We will try a medrol pack to calm things down. If she does great from this she can continue progressing with her activities. If this does not help we will get MRI to eval for possible meniscus tear and check on ACL healing. Everything was explained in detail to the patient who voiced understanding and agreement with the plan.    Arman Bogus, PA
